# Patient Record
Sex: Male | Born: 1975 | Race: White | Hispanic: No | Marital: Married | Smoking: Never smoker
Health system: Southern US, Community
[De-identification: ages and names within clinical notes are randomized; demographics above are authoritative.]

## PROBLEM LIST (undated history)

## (undated) DIAGNOSIS — E039 Hypothyroidism, unspecified: Secondary | ICD-10-CM

## (undated) DIAGNOSIS — E079 Disorder of thyroid, unspecified: Secondary | ICD-10-CM

## (undated) DIAGNOSIS — E785 Hyperlipidemia, unspecified: Secondary | ICD-10-CM

## (undated) DIAGNOSIS — I10 Essential (primary) hypertension: Secondary | ICD-10-CM

## (undated) HISTORY — DX: Hyperlipidemia, unspecified: E78.5

## (undated) HISTORY — DX: Essential (primary) hypertension: I10

## (undated) HISTORY — PX: JOINT REPLACEMENT: SHX530

## (undated) HISTORY — PX: VASECTOMY: SHX75

---

## 2011-03-11 ENCOUNTER — Ambulatory Visit (INDEPENDENT_AMBULATORY_CARE_PROVIDER_SITE_OTHER): Payer: BC Managed Care – PPO | Admitting: Internal Medicine

## 2011-03-11 ENCOUNTER — Encounter: Payer: Self-pay | Admitting: Internal Medicine

## 2011-03-11 ENCOUNTER — Other Ambulatory Visit (INDEPENDENT_AMBULATORY_CARE_PROVIDER_SITE_OTHER): Payer: BC Managed Care – PPO

## 2011-03-11 VITALS — BP 128/84 | HR 72 | Temp 98.1°F | Resp 16 | Wt 232.0 lb

## 2011-03-11 DIAGNOSIS — Z0000 Encounter for general adult medical examination without abnormal findings: Secondary | ICD-10-CM

## 2011-03-11 LAB — URINALYSIS, ROUTINE W REFLEX MICROSCOPIC
Bilirubin Urine: NEGATIVE
Hgb urine dipstick: NEGATIVE
Leukocytes, UA: NEGATIVE
Nitrite: NEGATIVE
Total Protein, Urine: NEGATIVE

## 2011-03-11 LAB — COMPREHENSIVE METABOLIC PANEL
Albumin: 4.1 g/dL (ref 3.5–5.2)
BUN: 8 mg/dL (ref 6–23)
CO2: 28 mEq/L (ref 19–32)
Calcium: 8.7 mg/dL (ref 8.4–10.5)
Chloride: 104 mEq/L (ref 96–112)
GFR: 76.12 mL/min (ref >60.00)
Glucose, Bld: 104 mg/dL — ABNORMAL HIGH (ref 70–99)
Potassium: 3.9 mEq/L (ref 3.5–5.1)

## 2011-03-11 LAB — CBC WITH DIFFERENTIAL/PLATELET
Basophils Relative: 0.5 % (ref 0.0–3.0)
Eosinophils Relative: 3.5 % (ref 0.0–5.0)
Hemoglobin: 14.9 g/dL (ref 13.0–17.0)
Lymphocytes Relative: 46 % (ref 12.0–46.0)
Neutrophils Relative %: 41.2 % — ABNORMAL LOW (ref 43.0–77.0)
RBC: 4.65 Mil/uL (ref 4.22–5.81)
WBC: 5.2 10*3/uL (ref 4.5–10.5)

## 2011-03-11 LAB — TSH: TSH: 9.11 u[IU]/mL — ABNORMAL HIGH (ref 0.35–5.50)

## 2011-03-11 LAB — LIPID PANEL
HDL: 25.4 mg/dL — ABNORMAL LOW (ref >39.00)
VLDL: 43.2 mg/dL — ABNORMAL HIGH (ref 0.0–40.0)

## 2011-03-11 NOTE — Progress Notes (Signed)
  Subjective:    Patient ID: Travis Sawyer, male    DOB: 06/27/1975, 36 y.o.   MRN: [id number]  HPI New to me for a complete physical, he offers no complaints today.   Review of Systems  Constitutional: Negative.   HENT: Negative.   Eyes: Negative.   Respiratory: Negative.   Cardiovascular: Negative.   Gastrointestinal: Negative.   Genitourinary: Negative.   Musculoskeletal: Negative.   Skin: Negative.   Neurological: Negative.   Hematological: Negative.   Psychiatric/Behavioral: Negative.        Objective:   Physical Exam  Vitals reviewed. Constitutional: He is oriented to person, place, and time. He appears well-developed and well-nourished. No distress.  HENT:  Head: Normocephalic and atraumatic.  Mouth/Throat: Oropharynx is clear and moist. No oropharyngeal exudate.  Eyes: Conjunctivae are normal. Right eye exhibits no discharge. Left eye exhibits no discharge. No scleral icterus.  Neck: Normal range of motion. Neck supple. No JVD present. No tracheal deviation present. No thyromegaly present.  Cardiovascular: Normal rate, regular rhythm, normal heart sounds and intact distal pulses.  Exam reveals no gallop and no friction rub.   No murmur heard. Pulmonary/Chest: Effort normal and breath sounds normal. No stridor. No respiratory distress. He has no wheezes. He has no rales. He exhibits no tenderness.  Abdominal: Soft. Bowel sounds are normal. He exhibits no distension. There is no tenderness. There is no rebound and no guarding. Hernia confirmed negative in the right inguinal area and confirmed negative in the left inguinal area.  Genitourinary: Testes normal and penis normal. Right testis shows no mass, no swelling and no tenderness. Right testis is descended. Left testis shows no mass, no swelling and no tenderness. Left testis is descended. Circumcised. No penile tenderness. No discharge found.  Musculoskeletal: Normal range of motion. He exhibits no edema and no tenderness.    Lymphadenopathy:    He has no cervical adenopathy.       Right: No inguinal adenopathy present.       Left: No inguinal adenopathy present.  Neurological: He is oriented to person, place, and time.  Skin: Skin is warm and dry. No rash noted. He is not diaphoretic. No erythema. No pallor.  Psychiatric: He has a normal mood and affect. His behavior is normal. Judgment and thought content normal.          Assessment & Plan:

## 2011-03-11 NOTE — Assessment & Plan Note (Signed)
Exam done, labs ordered, pt ed material was given 

## 2011-03-11 NOTE — Patient Instructions (Signed)
Health Maintenance, Males A healthy lifestyle and preventative care can promote health and wellness.  Maintain regular health, dental, and eye exams.   Eat a healthy diet. Foods like vegetables, fruits, whole grains, low-fat dairy products, and lean protein foods contain the nutrients you need without too many calories. Decrease your intake of foods high in solid fats, added sugars, and salt. Get information about a proper diet from your caregiver, if necessary.   Regular physical exercise is one of the most important things you can do for your health. Most adults should get at least 150 minutes of moderate-intensity exercise (any activity that increases your heart rate and causes you to sweat) each week. In addition, most adults need muscle-strengthening exercises on 2 or more days a week.    Maintain a healthy weight. The body mass index (BMI) is a screening tool to identify possible weight problems. It provides an estimate of body fat based on height and weight. Your caregiver can help determine your BMI, and can help you achieve or maintain a healthy weight. For adults 20 years and older:   A BMI below 18.5 is considered underweight.   A BMI of 18.5 to 24.9 is normal.   A BMI of 25 to 29.9 is considered overweight.   A BMI of 30 and above is considered obese.   Maintain normal blood lipids and cholesterol by exercising and minimizing your intake of saturated fat. Eat a balanced diet with plenty of fruits and vegetables. Blood tests for lipids and cholesterol should begin at age 20 and be repeated every 5 years. If your lipid or cholesterol levels are high, you are over 50, or you are a high risk for heart disease, you may need your cholesterol levels checked more frequently.Ongoing high lipid and cholesterol levels should be treated with medicines, if diet and exercise are not effective.   If you smoke, find out from your caregiver how to quit. If you do not use tobacco, do not start.    If you choose to drink alcohol, do not exceed 2 drinks per day. One drink is considered to be 12 ounces (355 mL) of beer, 5 ounces (148 mL) of wine, or 1.5 ounces (44 mL) of liquor.   Avoid use of street drugs. Do not share needles with anyone. Ask for help if you need support or instructions about stopping the use of drugs.   High blood pressure causes heart disease and increases the risk of stroke. Blood pressure should be checked at least every 1 to 2 years. Ongoing high blood pressure should be treated with medicines if weight loss and exercise are not effective.   If you are 45 to 34 years old, ask your caregiver if you should take aspirin to prevent heart disease.   Diabetes screening involves taking a blood sample to check your fasting blood sugar level. This should be done once every 3 years, after age 45, if you are within normal weight and without risk factors for diabetes. Testing should be considered at a younger age or be carried out more frequently if you are overweight and have at least 1 risk factor for diabetes.   Colorectal cancer can be detected and often prevented. Most routine colorectal cancer screening begins at the age of 50 and continues through age 75. However, your caregiver may recommend screening at an earlier age if you have risk factors for colon cancer. On a yearly basis, your caregiver may provide home test kits to check for hidden   blood in the stool. Use of a small camera at the end of a tube, to directly examine the colon (sigmoidoscopy or colonoscopy), can detect the earliest forms of colorectal cancer. Talk to your caregiver about this at age 50, when routine screening begins. Direct examination of the colon should be repeated every 5 to 10 years through age 75, unless early forms of pre-cancerous polyps or small growths are found.   Healthy men should no longer receive prostate-specific antigen (PSA) blood tests as part of routine cancer screening. Consult with  your caregiver about prostate cancer screening.   Practice safe sex. Use condoms and avoid high-risk sexual practices to reduce the spread of sexually transmitted infections (STIs).   Use sunscreen with a sun protection factor (SPF) of 30 or greater. Apply sunscreen liberally and repeatedly throughout the day. You should seek shade when your shadow is shorter than you. Protect yourself by wearing long sleeves, pants, a wide-brimmed hat, and sunglasses year round, whenever you are outdoors.   Notify your caregiver of new moles or changes in moles, especially if there is a change in shape or color. Also notify your caregiver if a mole is larger than the size of a pencil eraser.   A one-time screening for abdominal aortic aneurysm (AAA) and surgical repair of large AAAs by sound wave imaging (ultrasonography) is recommended for ages 65 to 75 years who are current or former smokers.   Stay current with your immunizations.  Document Released: 07/27/2007 Document Revised: 10/10/2010 Document Reviewed: 06/25/2010 [employer] Patient Information 2012 [employer]. 

## 2011-03-18 ENCOUNTER — Ambulatory Visit (INDEPENDENT_AMBULATORY_CARE_PROVIDER_SITE_OTHER): Payer: BC Managed Care – PPO | Admitting: Internal Medicine

## 2011-03-18 ENCOUNTER — Encounter: Payer: Self-pay | Admitting: Internal Medicine

## 2011-03-18 VITALS — BP 120/68 | HR 82 | Temp 98.0°F | Resp 16 | Wt 233.0 lb

## 2011-03-18 DIAGNOSIS — R7303 Prediabetes: Secondary | ICD-10-CM | POA: Insufficient documentation

## 2011-03-18 DIAGNOSIS — K760 Fatty (change of) liver, not elsewhere classified: Secondary | ICD-10-CM

## 2011-03-18 DIAGNOSIS — K7689 Other specified diseases of liver: Secondary | ICD-10-CM

## 2011-03-18 DIAGNOSIS — R7309 Other abnormal glucose: Secondary | ICD-10-CM

## 2011-03-18 DIAGNOSIS — E039 Hypothyroidism, unspecified: Secondary | ICD-10-CM

## 2011-03-18 DIAGNOSIS — E782 Mixed hyperlipidemia: Secondary | ICD-10-CM

## 2011-03-18 MED ORDER — LEVOTHYROXINE SODIUM 50 MCG PO CAPS: 1.0000 | ORAL_CAPSULE | Freq: Every day | ORAL | Status: DC

## 2011-03-18 NOTE — Patient Instructions (Signed)
Hypertriglyceridemia  Diet for High blood levels of Triglycerides Most fats in food are triglycerides. Triglycerides in your blood are stored as fat in your body. High levels of triglycerides in your blood may put you at a greater risk for heart disease and stroke.  Normal triglyceride levels are less than 150 mg/dL. Borderline high levels are 150-199 mg/dl. High levels are 200 - 499 mg/dL, and very high triglyceride levels are greater than 500 mg/dL. The decision to treat high triglycerides is generally based on the level. For people with borderline or high triglyceride levels, treatment includes weight loss and exercise. Drugs are recommended for people with very high triglyceride levels. Many people who need treatment for high triglyceride levels have metabolic syndrome. This syndrome is a collection of disorders that often include: insulin resistance, high blood pressure, blood clotting problems, high cholesterol and triglycerides. TESTING PROCEDURE FOR TRIGLYCERIDES  You should not eat 4 hours before getting your triglycerides measured. The normal range of triglycerides is between 10 and 250 milligrams per deciliter (mg/dl). Some people may have extreme levels (1000 or above), but your triglyceride level may be too high if it is above 150 mg/dl, depending on what other risk factors you have for heart disease.   People with high blood triglycerides may also have high blood cholesterol levels. If you have high blood cholesterol as well as high blood triglycerides, your risk for heart disease is probably greater than if you only had high triglycerides. High blood cholesterol is one of the main risk factors for heart disease.  CHANGING YOUR DIET  Your weight can affect your blood triglyceride level. If you are more than 20% above your ideal body weight, you may be able to lower your blood triglycerides by losing weight. Eating less and exercising regularly is the best way to combat this. Fat provides  more calories than any other food. The best way to lose weight is to eat less fat. Only 30% of your total calories should come from fat. Less than 7% of your diet should come from saturated fat. A diet low in fat and saturated fat is the same as a diet to decrease blood cholesterol. By eating a diet lower in fat, you may lose weight, lower your blood cholesterol, and lower your blood triglyceride level.  Eating a diet low in fat, especially saturated fat, may also help you lower your blood triglyceride level. Ask your dietitian to help you figure how much fat you can eat based on the number of calories your caregiver has prescribed for you.  Exercise, in addition to helping with weight loss may also help lower triglyceride levels.   Alcohol can increase blood triglycerides. You may need to stop drinking alcoholic beverages.   Too much carbohydrate in your diet may also increase your blood triglycerides. Some complex carbohydrates are necessary in your diet. These may include bread, rice, potatoes, other starchy vegetables and cereals.   Reduce "simple" carbohydrates. These may include pure sugars, candy, honey, and jelly without losing other nutrients. If you have the kind of high blood triglycerides that is affected by the amount of carbohydrates in your diet, you will need to eat less sugar and less high-sugar foods. Your caregiver can help you with this.   Adding 2-4 grams of fish oil (EPA+ DHA) may also help lower triglycerides. Speak with your caregiver before adding any supplements to your regimen.  Following the Diet  Maintain your ideal weight. Your caregivers can help you with a diet. Generally,   eating less food and getting more exercise will help you lose weight. Joining a weight control group may also help. Ask your caregivers for a good weight control group in your area.  Eat low-fat foods instead of high-fat foods. This can help you lose weight too.  These foods are lower in fat. Eat MORE  of these:   Dried beans, peas, and lentils.   Egg whites.   Low-fat cottage cheese.   Fish.   Lean cuts of meat, such as round, sirloin, rump, and flank (cut extra fat off meat you fix).   Whole grain breads, cereals and pasta.   Skim and nonfat dry milk.   Low-fat yogurt.   Poultry without the skin.   Cheese made with skim or part-skim milk, such as mozzarella, parmesan, farmers', ricotta, or pot cheese.  These are higher fat foods. Eat LESS of these:   Whole milk and foods made from whole milk, such as American, blue, cheddar, monterey jack, and swiss cheese   High-fat meats, such as luncheon meats, sausages, knockwurst, bratwurst, hot dogs, ribs, corned beef, ground pork, and regular ground beef.   Fried foods.  Limit saturated fats in your diet. Substituting unsaturated fat for saturated fat may decrease your blood triglyceride level. You will need to read package labels to know which products contain saturated fats.  These foods are high in saturated fat. Eat LESS of these:   Fried pork skins.   Whole milk.   Skin and fat from poultry.   Palm oil.   Butter.   Shortening.   Cream cheese.   Bacon.   Margarines and baked goods made from listed oils.   Vegetable shortenings.   Chitterlings.   Fat from meats.   Coconut oil.   Palm kernel oil.   Lard.   Cream.   Sour cream.   Fatback.   Coffee whiteners and non-dairy creamers made with these oils.   Cheese made from whole milk.  Use unsaturated fats (both polyunsaturated and monounsaturated) moderately. Remember, even though unsaturated fats are better than saturated fats; you still want a diet low in total fat.  These foods are high in unsaturated fat:   Canola oil.   Sunflower oil.   Mayonnaise.   Almonds.   Peanuts.   Pine nuts.   Margarines made with these oils.   Safflower oil.   Olive oil.   Avocados.   Cashews.   Peanut butter.   Sunflower seeds.   Soybean oil.     Peanut oil.   Olives.   Pecans.   Walnuts.   Pumpkin seeds.  Avoid sugar and other high-sugar foods. This will decrease carbohydrates without decreasing other nutrients. Sugar in your food goes rapidly to your blood. When there is excess sugar in your blood, your liver may use it to make more triglycerides. Sugar also contains calories without other important nutrients.  Eat LESS of these:   Sugar, brown sugar, powdered sugar, jam, jelly, preserves, honey, syrup, molasses, pies, candy, cakes, cookies, frosting, pastries, colas, soft drinks, punches, fruit drinks, and regular gelatin.   Avoid alcohol. Alcohol, even more than sugar, may increase blood triglycerides. In addition, alcohol is high in calories and low in nutrients. Ask for sparkling water, or a diet soft drink instead of an alcoholic beverage.  Suggestions for planning and preparing meals   Bake, broil, grill or roast meats instead of frying.   Remove fat from meats and skin from poultry before cooking.   Add spices,   herbs, lemon juice or vinegar to vegetables instead of salt, rich sauces or gravies.   Use a non-stick skillet without fat or use no-stick sprays.   Cool and refrigerate stews and broth. Then remove the hardened fat floating on the surface before serving.   Refrigerate meat drippings and skim off fat to make low-fat gravies.   Serve more fish.   Use less butter, margarine and other high-fat spreads on bread or vegetables.   Use skim or reconstituted non-fat dry milk for cooking.   Cook with low-fat cheeses.   Substitute low-fat yogurt or cottage cheese for all or part of the sour cream in recipes for sauces, dips or congealed salads.   Use half yogurt/half mayonnaise in salad recipes.   Substitute evaporated skim milk for cream. Evaporated skim milk or reconstituted non-fat dry milk can be whipped and substituted for whipped cream in certain recipes.   Choose fresh fruits for dessert instead of  high-fat foods such as pies or cakes. Fruits are naturally low in fat.  When Dining Out   Order low-fat appetizers such as fruit or vegetable juice, pasta with vegetables or tomato sauce.   Select clear, rather than cream soups.   Ask that dressings and gravies be served on the side. Then use less of them.   Order foods that are baked, broiled, poached, steamed, stir-fried, or roasted.   Ask for margarine instead of butter, and use only a small amount.   Drink sparkling water, unsweetened tea or coffee, or diet soft drinks instead of alcohol or other sweet beverages.  QUESTIONS AND ANSWERS ABOUT OTHER FATS IN THE BLOOD: SATURATED FAT, TRANS FAT, AND CHOLESTEROL What is trans fat? Trans fat is a type of fat that is formed when vegetable oil is hardened through a process called hydrogenation. This process helps makes foods more solid, gives them shape, and prolongs their shelf life. Trans fats are also called hydrogenated or partially hydrogenated oils.  What do saturated fat, trans fat, and cholesterol in foods have to do with heart disease? Saturated fat, trans fat, and cholesterol in the diet all raise the level of LDL "bad" cholesterol in the blood. The higher the LDL cholesterol, the greater the risk for coronary heart disease (CHD). Saturated fat and trans fat raise LDL similarly.  What foods contain saturated fat, trans fat, and cholesterol? High amounts of saturated fat are found in animal products, such as fatty cuts of meat, chicken skin, and full-fat dairy products like butter, whole milk, cream, and cheese, and in tropical vegetable oils such as palm, palm kernel, and coconut oil. Trans fat is found in some of the same foods as saturated fat, such as vegetable shortening, some margarines (especially hard or stick margarine), crackers, cookies, baked goods, fried foods, salad dressings, and other processed foods made with partially hydrogenated vegetable oils. Small amounts of trans fat  also occur naturally in some animal products, such as milk products, beef, and lamb. Foods high in cholesterol include liver, other organ meats, egg yolks, shrimp, and full-fat dairy products. How can I use the new food label to make heart-healthy food choices? Check the Nutrition Facts panel of the food label. Choose foods lower in saturated fat, trans fat, and cholesterol. For saturated fat and cholesterol, you can also use the Percent Daily Value (%DV): 5% DV or less is low, and 20% DV or more is high. (There is no %DV for trans fat.) Use the Nutrition Facts panel to choose foods low in   saturated fat and cholesterol, and if the trans fat is not listed, read the ingredients and limit products that list shortening or hydrogenated or partially hydrogenated vegetable oil, which tend to be high in trans fat. POINTS TO REMEMBER: YOU NEED A LITTLE TLC (THERAPEUTIC LIFESTYLE CHANGES)  Discuss your risk for heart disease with your caregivers, and take steps to reduce risk factors.   Change your diet. Choose foods that are low in saturated fat, trans fat, and cholesterol.   Add exercise to your daily routine if it is not already being done. Participate in physical activity of moderate intensity, like brisk walking, for at least 30 minutes on most, and preferably all days of the week. No time? Break the 30 minutes into three, 10-minute segments during the day.   Stop smoking. If you do smoke, contact your caregiver to discuss ways in which they can help you quit.   Do not use street drugs.   Maintain a normal weight.   Maintain a healthy blood pressure.   Keep up with your blood work for checking the fats in your blood as directed by your caregiver.  Document Released: 11/16/2003 Document Revised: 10/10/2010 Document Reviewed: 06/13/2008 [facility] Patient Information 2012 [place], [place].Hypothyroidism The thyroid is a large gland located in the lower front of your neck. The thyroid gland helps control  metabolism. Metabolism is how your body handles food. It controls metabolism with the hormone thyroxine. When this gland is underactive (hypothyroid), it produces too little hormone.  CAUSES These include:   Absence or destruction of thyroid tissue.   Goiter due to iodine deficiency.   Goiter due to medications.   Congenital defects (since birth).   Problems with the pituitary. This causes a lack of TSH (thyroid stimulating hormone). This hormone tells the thyroid to turn out more hormone.  SYMPTOMS  Lethargy (feeling as though you have no energy)   Cold intolerance   Weight gain (in spite of normal food intake)   Dry skin   Coarse hair   Menstrual irregularity (if severe, may lead to infertility)   Slowing of thought processes  Cardiac problems are also caused by insufficient amounts of thyroid hormone. Hypothyroidism in the newborn is cretinism, and is an extreme form. It is important that this form be treated adequately and immediately or it will lead rapidly to retarded physical and mental development. DIAGNOSIS  To prove hypothyroidism, your caregiver may do blood tests and ultrasound tests. Sometimes the signs are hidden. It may be necessary for your caregiver to watch this illness with blood tests either before or after diagnosis and treatment. TREATMENT  Low levels of thyroid hormone are increased by using synthetic thyroid hormone. This is a safe, effective treatment. It usually takes about four weeks to gain the full effects of the medication. After you have the full effect of the medication, it will generally take another four weeks for problems to leave. Your caregiver may start you on low doses. If you have had heart problems the dose may be gradually increased. It is generally not an emergency to get rapidly to normal. HOME CARE INSTRUCTIONS   Take your medications as your caregiver suggests. Let your caregiver know of any medications you are taking or start taking.  Your caregiver will help you with dosage schedules.   As your condition improves, your dosage needs may increase. It will be necessary to have continuing blood tests as suggested by your caregiver.   Report all suspected medication side effects to your  caregiver.  SEEK MEDICAL CARE IF: Seek medical care if you develop:  Sweating.   Tremulousness (tremors).   Anxiety.   Rapid weight loss.   Heat intolerance.   Emotional swings.   Diarrhea.   Weakness.  SEEK IMMEDIATE MEDICAL CARE IF:  You develop chest pain, an irregular heart beat (palpitations), or a rapid heart beat. MAKE SURE YOU:   Understand these instructions.   Will watch your condition.   Will get help right away if you are not doing well or get worse.  Document Released: 01/28/2005 Document Revised: 10/10/2010 Document Reviewed: 09/18/2007 [facility] Patient Information 2012 [place], [place].

## 2011-03-18 NOTE — Progress Notes (Signed)
  Subjective:    Patient ID: Travis Sawyer, male    DOB: 1976/01/10, 36 y.o.   MRN: [id number]  Thyroid Problem Presents for follow-up visit. Symptoms include dry skin, fatigue and weight gain. Patient reports no anxiety, cold intolerance, constipation, depressed mood, diaphoresis, diarrhea, hair loss, heat intolerance, hoarse voice, leg swelling, nail problem, palpitations, tremors, visual change or weight loss. The symptoms have been stable.      Review of Systems  Constitutional: Positive for weight gain, fatigue and unexpected weight change (some weight gain). Negative for fever, chills, weight loss, diaphoresis, activity change and appetite change.  HENT: Negative.  Negative for hoarse voice.   Eyes: Negative.   Respiratory: Negative.   Cardiovascular: Negative for chest pain, palpitations and leg swelling.  Gastrointestinal: Negative for nausea, vomiting, abdominal pain, diarrhea, constipation, blood in stool, abdominal distention and anal bleeding.  Genitourinary: Negative.   Musculoskeletal: Negative for myalgias, back pain, arthralgias and gait problem.  Skin: Negative.   Neurological: Negative.  Negative for tremors.  Hematological: Negative for cold intolerance, heat intolerance and adenopathy. Does not bruise/bleed easily.  Psychiatric/Behavioral: Negative.        Objective:   Physical Exam  Vitals reviewed. Constitutional: He is oriented to person, place, and time. He appears well-developed and well-nourished. No distress.  HENT:  Head: Normocephalic and atraumatic.  Mouth/Throat: Oropharynx is clear and moist. No oropharyngeal exudate.  Eyes: Conjunctivae are normal. Right eye exhibits no discharge. Left eye exhibits no discharge. No scleral icterus.  Neck: Normal range of motion. Neck supple. No JVD present. No tracheal deviation present. No thyromegaly present.  Cardiovascular: Normal rate, regular rhythm, normal heart sounds and intact distal pulses.  Exam reveals no  gallop and no friction rub.   No murmur heard. Pulmonary/Chest: Effort normal and breath sounds normal. No stridor. No respiratory distress. He has no wheezes. He has no rales. He exhibits no tenderness.  Abdominal: Soft. Bowel sounds are normal. He exhibits no distension and no mass. There is no tenderness. There is no rebound and no guarding.  Musculoskeletal: Normal range of motion. He exhibits no edema and no tenderness.  Lymphadenopathy:    He has no cervical adenopathy.  Neurological: He is oriented to person, place, and time.  Skin: Skin is warm and dry. No rash noted. He is not diaphoretic. No erythema. No pallor.  Psychiatric: He has a normal mood and affect. His behavior is normal. Judgment and thought content normal.     Lab Results  Component Value Date   WBC 5.2 03/11/2011   HGB 14.9 03/11/2011   HCT 41.6 03/11/2011   PLT 168.0 03/11/2011   GLUCOSE 104* 03/11/2011   CHOL 186 03/11/2011   TRIG 216.0* 03/11/2011   HDL 25.40* 03/11/2011   LDLDIRECT 116.1 03/11/2011   ALT 74* 03/11/2011   AST 44* 03/11/2011   NA 140 03/11/2011   K 3.9 03/11/2011   CL 104 03/11/2011   CREATININE 1.2 03/11/2011   BUN 8 03/11/2011   CO2 28 03/11/2011   TSH 9.11* 03/11/2011       Assessment & Plan:

## 2011-03-19 ENCOUNTER — Encounter: Payer: Self-pay | Admitting: Internal Medicine

## 2011-03-19 NOTE — Assessment & Plan Note (Signed)
Start working with a nutritionist

## 2011-03-19 NOTE — Assessment & Plan Note (Signed)
Start tirosint 

## 2011-03-19 NOTE — Assessment & Plan Note (Signed)
He will work on lifestyle modifications and he will see a nutritionist

## 2011-03-19 NOTE — Assessment & Plan Note (Signed)
He will work on lifestyle modifications

## 2011-05-20 ENCOUNTER — Encounter: Payer: Self-pay | Admitting: Internal Medicine

## 2011-05-20 ENCOUNTER — Other Ambulatory Visit (INDEPENDENT_AMBULATORY_CARE_PROVIDER_SITE_OTHER): Payer: BC Managed Care – PPO

## 2011-05-20 ENCOUNTER — Ambulatory Visit (INDEPENDENT_AMBULATORY_CARE_PROVIDER_SITE_OTHER): Payer: BC Managed Care – PPO | Admitting: Internal Medicine

## 2011-05-20 VITALS — BP 118/80 | HR 87 | Temp 98.6°F | Resp 16 | Ht 68.0 in | Wt 221.0 lb

## 2011-05-20 DIAGNOSIS — K7689 Other specified diseases of liver: Secondary | ICD-10-CM

## 2011-05-20 DIAGNOSIS — K760 Fatty (change of) liver, not elsewhere classified: Secondary | ICD-10-CM

## 2011-05-20 DIAGNOSIS — E039 Hypothyroidism, unspecified: Secondary | ICD-10-CM

## 2011-05-20 DIAGNOSIS — R7309 Other abnormal glucose: Secondary | ICD-10-CM

## 2011-05-20 DIAGNOSIS — E782 Mixed hyperlipidemia: Secondary | ICD-10-CM

## 2011-05-20 LAB — COMPREHENSIVE METABOLIC PANEL
Albumin: 4.3 g/dL (ref 3.5–5.2)
BUN: 12 mg/dL (ref 6–23)
CO2: 28 mEq/L (ref 19–32)
Calcium: 9.4 mg/dL (ref 8.4–10.5)
Chloride: 101 mEq/L (ref 96–112)
GFR: 76.03 mL/min (ref >60.00)
Glucose, Bld: 107 mg/dL — ABNORMAL HIGH (ref 70–99)
Potassium: 3.8 mEq/L (ref 3.5–5.1)
Sodium: 139 mEq/L (ref 135–145)
Total Protein: 7.3 g/dL (ref 6.0–8.3)

## 2011-05-20 LAB — LDL CHOLESTEROL, DIRECT: Direct LDL: 118.6 mg/dL

## 2011-05-20 LAB — LIPID PANEL: HDL: 28.2 mg/dL — ABNORMAL LOW (ref >39.00)

## 2011-05-20 LAB — TSH: TSH: 4.05 u[IU]/mL (ref 0.35–5.50)

## 2011-05-20 NOTE — Assessment & Plan Note (Signed)
Will check his CMP today

## 2011-05-20 NOTE — Patient Instructions (Signed)

## 2011-05-20 NOTE — Progress Notes (Signed)
  Subjective:    Patient ID: Travis Sawyer, male    DOB: [birth year], 36 y.o.   MRN: [id number]  Thyroid Problem Presents for follow-up visit. Patient reports no anxiety, cold intolerance, constipation, depressed mood, diaphoresis, diarrhea, dry skin, fatigue, hair loss, heat intolerance, hoarse voice, leg swelling, nail problem, palpitations, tremors, visual change, weight gain or weight loss. The symptoms have been improving.      Review of Systems  Constitutional: Negative for fever, chills, weight loss, weight gain, diaphoresis, activity change, appetite change, fatigue and unexpected weight change.  HENT: Negative.  Negative for hoarse voice.   Eyes: Negative.   Respiratory: Negative for apnea, cough, choking, shortness of breath, wheezing and stridor.   Cardiovascular: Negative for chest pain, palpitations and leg swelling.  Gastrointestinal: Negative for nausea, vomiting, abdominal pain, diarrhea, constipation and blood in stool.  Genitourinary: Negative.   Musculoskeletal: Negative for myalgias, back pain, arthralgias and gait problem.  Skin: Negative for color change, pallor, rash and wound.  Neurological: Negative.  Negative for tremors.  Hematological: Negative for cold intolerance, heat intolerance and adenopathy. Does not bruise/bleed easily.  Psychiatric/Behavioral: Negative.        Objective:   Physical Exam  Vitals reviewed. Constitutional: He is oriented to person, place, and time. He appears well-developed and well-nourished. No distress.  HENT:  Head: Normocephalic and atraumatic.  Mouth/Throat: Oropharynx is clear and moist. No oropharyngeal exudate.  Eyes: Conjunctivae are normal. Right eye exhibits no discharge. Left eye exhibits no discharge. No scleral icterus.  Neck: Normal range of motion. Neck supple. No JVD present. No tracheal deviation present. No thyromegaly present.  Cardiovascular: Normal rate, regular rhythm, normal heart sounds and intact distal  pulses.  Exam reveals no gallop and no friction rub.   No murmur heard. Pulmonary/Chest: Effort normal and breath sounds normal. No stridor. No respiratory distress. He has no wheezes. He has no rales. He exhibits no tenderness.  Abdominal: Soft. Bowel sounds are normal. He exhibits no distension and no mass. There is no tenderness. There is no rebound and no guarding.  Musculoskeletal: Normal range of motion. He exhibits no edema and no tenderness.  Lymphadenopathy:    He has no cervical adenopathy.  Neurological: He is oriented to person, place, and time.  Skin: Skin is warm and dry. No rash noted. He is not diaphoretic. No erythema. No pallor.  Psychiatric: He has a normal mood and affect. His behavior is normal. Judgment and thought content normal.      Lab Results  Component Value Date   WBC 5.2 03/11/2011   HGB 14.9 03/11/2011   HCT 41.6 03/11/2011   PLT 168.0 03/11/2011   GLUCOSE 104* 03/11/2011   CHOL 186 03/11/2011   TRIG 216.0* 03/11/2011   HDL 25.40* 03/11/2011   LDLDIRECT 116.1 03/11/2011   ALT 74* 03/11/2011   AST 44* 03/11/2011   NA 140 03/11/2011   K 3.9 03/11/2011   CL 104 03/11/2011   CREATININE 1.2 03/11/2011   BUN 8 03/11/2011   CO2 28 03/11/2011   TSH 9.11* 03/11/2011      Assessment & Plan:

## 2011-05-20 NOTE — Assessment & Plan Note (Signed)
a1c today to screen for DM II

## 2011-05-20 NOTE — Assessment & Plan Note (Signed)
I will recheck his TSH level today 

## 2011-05-20 NOTE — Assessment & Plan Note (Signed)
Will recheck the trigs level today

## 2011-05-23 ENCOUNTER — Encounter: Payer: Self-pay | Admitting: Internal Medicine

## 2011-05-23 LAB — HEMOGLOBIN A1C: Hgb A1c MFr Bld: 5.6 % (ref 4.6–6.5)

## 2011-06-11 ENCOUNTER — Ambulatory Visit: Payer: BC Managed Care – PPO | Admitting: *Deleted

## 2011-10-15 ENCOUNTER — Other Ambulatory Visit: Payer: Self-pay

## 2011-10-15 DIAGNOSIS — E039 Hypothyroidism, unspecified: Secondary | ICD-10-CM

## 2011-10-15 MED ORDER — LEVOTHYROXINE SODIUM 50 MCG PO CAPS: 1.0000 | ORAL_CAPSULE | Freq: Every day | ORAL | Status: DC

## 2012-10-26 ENCOUNTER — Other Ambulatory Visit: Payer: Self-pay | Admitting: Internal Medicine

## 2012-11-13 ENCOUNTER — Other Ambulatory Visit: Payer: Self-pay | Admitting: Internal Medicine

## 2012-12-02 ENCOUNTER — Other Ambulatory Visit: Payer: Self-pay | Admitting: Internal Medicine

## 2013-02-19 ENCOUNTER — Encounter: Payer: Self-pay | Admitting: Internal Medicine

## 2013-02-19 ENCOUNTER — Other Ambulatory Visit (INDEPENDENT_AMBULATORY_CARE_PROVIDER_SITE_OTHER): Payer: BC Managed Care – PPO

## 2013-02-19 ENCOUNTER — Ambulatory Visit (INDEPENDENT_AMBULATORY_CARE_PROVIDER_SITE_OTHER): Payer: BC Managed Care – PPO | Admitting: Internal Medicine

## 2013-02-19 VITALS — BP 116/80 | HR 63 | Temp 97.8°F | Resp 16 | Ht 68.0 in | Wt 234.5 lb

## 2013-02-19 DIAGNOSIS — Z0000 Encounter for general adult medical examination without abnormal findings: Secondary | ICD-10-CM

## 2013-02-19 DIAGNOSIS — R7309 Other abnormal glucose: Secondary | ICD-10-CM

## 2013-02-19 DIAGNOSIS — E039 Hypothyroidism, unspecified: Secondary | ICD-10-CM

## 2013-02-19 LAB — CBC WITH DIFFERENTIAL/PLATELET
Basophils Absolute: 0 10*3/uL (ref 0.0–0.1)
Basophils Relative: 0.1 % (ref 0.0–3.0)
EOS PCT: 2.8 % (ref 0.0–5.0)
Eosinophils Absolute: 0.2 10*3/uL (ref 0.0–0.7)
HEMATOCRIT: 45.8 % (ref 39.0–52.0)
HEMOGLOBIN: 15.8 g/dL (ref 13.0–17.0)
LYMPHS ABS: 2.7 10*3/uL (ref 0.7–4.0)
Lymphocytes Relative: 37.7 % (ref 12.0–46.0)
MCHC: 34.5 g/dL (ref 30.0–36.0)
MCV: 91.5 fl (ref 78.0–100.0)
MONOS PCT: 5.2 % (ref 3.0–12.0)
Monocytes Absolute: 0.4 10*3/uL (ref 0.1–1.0)
NEUTROS ABS: 3.8 10*3/uL (ref 1.4–7.7)
Neutrophils Relative %: 54.2 % (ref 43.0–77.0)
Platelets: 216 10*3/uL (ref 150.0–400.0)
RBC: 5 Mil/uL (ref 4.22–5.81)
RDW: 13 % (ref 11.5–14.6)
WBC: 7.1 10*3/uL (ref 4.5–10.5)

## 2013-02-19 LAB — COMPREHENSIVE METABOLIC PANEL
ALT: 37 U/L (ref 0–53)
AST: 22 U/L (ref 0–37)
Albumin: 4.2 g/dL (ref 3.5–5.2)
Alkaline Phosphatase: 38 U/L — ABNORMAL LOW (ref 39–117)
BILIRUBIN TOTAL: 0.7 mg/dL (ref 0.3–1.2)
BUN: 12 mg/dL (ref 6–23)
CO2: 27 mEq/L (ref 19–32)
CREATININE: 1.1 mg/dL (ref 0.4–1.5)
Calcium: 9 mg/dL (ref 8.4–10.5)
Chloride: 106 mEq/L (ref 96–112)
GFR: 85.26 mL/min (ref >60.00)
Glucose, Bld: 101 mg/dL — ABNORMAL HIGH (ref 70–99)
Potassium: 4.6 mEq/L (ref 3.5–5.1)
Sodium: 139 mEq/L (ref 135–145)
Total Protein: 7.2 g/dL (ref 6.0–8.3)

## 2013-02-19 LAB — LIPID PANEL
CHOLESTEROL: 251 mg/dL — AB (ref 0–200)
HDL: 31.6 mg/dL — ABNORMAL LOW (ref >39.00)
TRIGLYCERIDES: 155 mg/dL — AB (ref 0.0–149.0)
Total CHOL/HDL Ratio: 8
VLDL: 31 mg/dL (ref 0.0–40.0)

## 2013-02-19 LAB — TSH: TSH: 11.73 u[IU]/mL — ABNORMAL HIGH (ref 0.35–5.50)

## 2013-02-19 LAB — LDL CHOLESTEROL, DIRECT: LDL DIRECT: 184.5 mg/dL

## 2013-02-19 LAB — HEMOGLOBIN A1C: Hgb A1c MFr Bld: 5.8 % (ref 4.6–6.5)

## 2013-02-19 MED ORDER — LEVOTHYROXINE SODIUM 50 MCG PO TABS: 50.0000 ug | ORAL_TABLET | Freq: Every day | ORAL | Status: DC

## 2013-02-19 NOTE — Assessment & Plan Note (Signed)
He has not taken the tirosint for 5 months I will recheck his TSH level today and will advise further

## 2013-02-19 NOTE — Progress Notes (Signed)
Pre visit review using our clinic review tool, if applicable. No additional management support is needed unless otherwise documented below in the visit note. 

## 2013-02-19 NOTE — Progress Notes (Signed)
Subjective:    Patient ID: Travis Sawyer, male    DOB: 07/26/1975, 38 y.o.   MRN: [id number]  Thyroid Problem Presents for follow-up visit. Symptoms include fatigue and weight gain. Patient reports no anxiety, cold intolerance, constipation, depressed mood, diaphoresis, diarrhea, dry skin, hair loss, heat intolerance, hoarse voice, leg swelling, nail problem, palpitations, tremors, visual change or weight loss. The symptoms have been worsening. Past treatments include levothyroxine. The treatment provided mild relief.      Review of Systems  Constitutional: Positive for weight gain, fatigue and unexpected weight change. Negative for fever, weight loss and diaphoresis.  HENT: Negative.  Negative for hoarse voice.   Eyes: Negative.   Respiratory: Negative.  Negative for chest tightness.   Cardiovascular: Negative.  Negative for chest pain, palpitations and leg swelling.  Gastrointestinal: Negative.  Negative for abdominal pain, diarrhea and constipation.  Endocrine: Negative.  Negative for cold intolerance and heat intolerance.  Genitourinary: Negative.   Musculoskeletal: Negative.   Allergic/Immunologic: Negative.   Neurological: Negative.  Negative for tremors.  Hematological: Negative.   Psychiatric/Behavioral: Negative.        Objective:   Physical Exam  Vitals reviewed. Constitutional: He is oriented to person, place, and time. He appears well-developed and well-nourished. No distress.  HENT:  Head: Normocephalic and atraumatic.  Mouth/Throat: No oropharyngeal exudate.  Eyes: Conjunctivae are normal. Right eye exhibits no discharge. Left eye exhibits no discharge. No scleral icterus.  Neck: Normal range of motion. Neck supple. No JVD present. No tracheal deviation present. No thyromegaly present.  Cardiovascular: Normal rate, regular rhythm, normal heart sounds and intact distal pulses.  Exam reveals no gallop and no friction rub.   No murmur heard. Pulmonary/Chest: Effort  normal and breath sounds normal. No stridor. No respiratory distress. He has no wheezes. He has no rales. He exhibits no tenderness.  Abdominal: Soft. Bowel sounds are normal. He exhibits no distension and no mass. There is no tenderness. There is no rebound and no guarding. Hernia confirmed negative in the right inguinal area and confirmed negative in the left inguinal area.  Genitourinary: Testes normal and penis normal. Right testis shows no mass, no swelling and no tenderness. Right testis is descended. Left testis shows no mass, no swelling and no tenderness. Left testis is descended. Circumcised. No penile erythema or penile tenderness. No discharge found.  Musculoskeletal: Normal range of motion. He exhibits no edema and no tenderness.  Lymphadenopathy:    He has no cervical adenopathy.       Right: No inguinal adenopathy present.       Left: No inguinal adenopathy present.  Neurological: He is oriented to person, place, and time.  Skin: Skin is warm and dry. No rash noted. He is not diaphoretic. No erythema. No pallor.  Psychiatric: He has a normal mood and affect. His behavior is normal. Judgment and thought content normal.    Lab Results  Component Value Date   WBC 5.2 03/11/2011   HGB 14.9 03/11/2011   HCT 41.6 03/11/2011   PLT 168.0 03/11/2011   GLUCOSE 107* 05/20/2011   CHOL 177 05/20/2011   TRIG 217.0* 05/20/2011   HDL 28.20* 05/20/2011   LDLDIRECT 118.6 05/20/2011   ALT 31 05/20/2011   AST 18 05/20/2011   NA 139 05/20/2011   K 3.8 05/20/2011   CL 101 05/20/2011   CREATININE 1.2 05/20/2011   BUN 12 05/20/2011   CO2 28 05/20/2011   TSH 4.05 05/20/2011   HGBA1C 5.6 05/20/2011  Assessment & Plan:

## 2013-02-19 NOTE — Addendum Note (Signed)
Addended by: Etta GrandchildJONES, Vlasta Baskin L on: 02/19/2013 01:06 PM   Modules accepted: Orders

## 2013-02-19 NOTE — Assessment & Plan Note (Signed)
I will check his A1C to see if he has developed DM2 

## 2013-02-19 NOTE — Assessment & Plan Note (Signed)
Exam done Vaccines were reviewed Labs ordered Pt ed material was given 

## 2013-02-19 NOTE — Patient Instructions (Signed)
Hypothyroidism The thyroid is a large gland located in the lower front of your neck. The thyroid gland helps control metabolism. Metabolism is how your body handles food. It controls metabolism with the hormone thyroxine. When this gland is underactive (hypothyroid), it produces too little hormone.  CAUSES These include:   Absence or destruction of thyroid tissue.  Goiter due to iodine deficiency.  Goiter due to medications.  Congenital defects (since birth).  Problems with the pituitary. This causes a lack of TSH (thyroid stimulating hormone). This hormone tells the thyroid to turn out more hormone. SYMPTOMS  Lethargy (feeling as though you have no energy)  Cold intolerance  Weight gain (in spite of normal food intake)  Dry skin  Coarse hair  Menstrual irregularity (if severe, may lead to infertility)  Slowing of thought processes Cardiac problems are also caused by insufficient amounts of thyroid hormone. Hypothyroidism in the newborn is cretinism, and is an extreme form. It is important that this form be treated adequately and immediately or it will lead rapidly to retarded physical and mental development. DIAGNOSIS  To prove hypothyroidism, your caregiver may do blood tests and ultrasound tests. Sometimes the signs are hidden. It may be necessary for your caregiver to watch this illness with blood tests either before or after diagnosis and treatment. TREATMENT  Low levels of thyroid hormone are increased by using synthetic thyroid hormone. This is a safe, effective treatment. It usually takes about four weeks to gain the full effects of the medication. After you have the full effect of the medication, it will generally take another four weeks for problems to leave. Your caregiver may start you on low doses. If you have had heart problems the dose may be gradually increased. It is generally not an emergency to get rapidly to normal. HOME CARE INSTRUCTIONS   Take your  medications as your caregiver suggests. Let your caregiver know of any medications you are taking or start taking. Your caregiver will help you with dosage schedules.  As your condition improves, your dosage needs may increase. It will be necessary to have continuing blood tests as suggested by your caregiver.  Report all suspected medication side effects to your caregiver. SEEK MEDICAL CARE IF: Seek medical care if you develop:  Sweating.  Tremulousness (tremors).  Anxiety.  Rapid weight loss.  Heat intolerance.  Emotional swings.  Diarrhea.  Weakness. SEEK IMMEDIATE MEDICAL CARE IF:  You develop chest pain, an irregular heart beat (palpitations), or a rapid heart beat. MAKE SURE YOU:   Understand these instructions.  Will watch your condition.  Will get help right away if you are not doing well or get worse. Document Released: 01/28/2005 Document Revised: 04/22/2011 Document Reviewed: 09/18/2007 [facility] Patient Information 2014 [place], [place]. Health Maintenance, Males A healthy lifestyle and preventative care can promote health and wellness.  Maintain regular health, dental, and eye exams.  Eat a healthy diet. Foods like vegetables, fruits, whole grains, low-fat dairy products, and lean protein foods contain the nutrients you need without too many calories. Decrease your intake of foods high in solid fats, added sugars, and salt. Get information about a proper diet from your caregiver, if necessary.  Regular physical exercise is one of the most important things you can do for your health. Most adults should get at least 150 minutes of moderate-intensity exercise (any activity that increases your heart rate and causes you to sweat) each week. In addition, most adults need muscle-strengthening exercises on 2 or more days a week.  Maintain a healthy weight. The body mass index (BMI) is a screening tool to identify possible weight problems. It provides an estimate of  body fat based on height and weight. Your caregiver can help determine your BMI, and can help you achieve or maintain a healthy weight. For adults 20 years and older:  A BMI below 18.5 is considered underweight.  A BMI of 18.5 to 24.9 is normal.  A BMI of 25 to 29.9 is considered overweight.  A BMI of 30 and above is considered obese.  Maintain normal blood lipids and cholesterol by exercising and minimizing your intake of saturated fat. Eat a balanced diet with plenty of fruits and vegetables. Blood tests for lipids and cholesterol should begin at age 86 and be repeated every 5 years. If your lipid or cholesterol levels are high, you are over 50, or you are a high risk for heart disease, you may need your cholesterol levels checked more frequently.Ongoing high lipid and cholesterol levels should be treated with medicines, if diet and exercise are not effective.  If you smoke, find out from your caregiver how to quit. If you do not use tobacco, do not start.  Lung cancer screening is recommended for adults aged 51 80 years who are at high risk for developing lung cancer because of a history of smoking. Yearly low-dose computed tomography (CT) is recommended for people who have at least a 30-pack-year history of smoking and are a current smoker or have quit within the past 15 years. A pack year of smoking is smoking an average of 1 pack of cigarettes a day for 1 year (for example: 1 pack a day for 30 years or 2 packs a day for 15 years). Yearly screening should continue until the smoker has stopped smoking for at least 15 years. Yearly screening should also be stopped for people who develop a health problem that would prevent them from having lung cancer treatment.  If you choose to drink alcohol, do not exceed 2 drinks per day. One drink is considered to be 12 ounces (355 mL) of beer, 5 ounces (148 mL) of wine, or 1.5 ounces (44 mL) of liquor.  Avoid use of street drugs. Do not share needles with  anyone. Ask for help if you need support or instructions about stopping the use of drugs.  High blood pressure causes heart disease and increases the risk of stroke. Blood pressure should be checked at least every 1 to 2 years. Ongoing high blood pressure should be treated with medicines if weight loss and exercise are not effective.  If you are 67 to 36 years old, ask your caregiver if you should take aspirin to prevent heart disease.  Diabetes screening involves taking a blood sample to check your fasting blood sugar level. This should be done once every 3 years, after age 72, if you are within normal weight and without risk factors for diabetes. Testing should be considered at a younger age or be carried out more frequently if you are overweight and have at least 1 risk factor for diabetes.  Colorectal cancer can be detected and often prevented. Most routine colorectal cancer screening begins at the age of 61 and continues through age 54. However, your caregiver may recommend screening at an earlier age if you have risk factors for colon cancer. On a yearly basis, your caregiver may provide home test kits to check for hidden blood in the stool. Use of a small camera at the end of a  tube, to directly examine the colon (sigmoidoscopy or colonoscopy), can detect the earliest forms of colorectal cancer. Talk to your caregiver about this at age 80, when routine screening begins. Direct examination of the colon should be repeated every 5 to 10 years through age 65, unless early forms of pre-cancerous polyps or small growths are found.  Hepatitis C blood testing is recommended for all people born from [birth year] through 1963 and any individual with known risks for hepatitis C.  Healthy men should no longer receive prostate-specific antigen (PSA) blood tests as part of routine cancer screening. Consult with your caregiver about prostate cancer screening.  Testicular cancer screening is not recommended for  adolescents or adult males who have no symptoms. Screening includes self-exam, caregiver exam, and other screening tests. Consult with your caregiver about any symptoms you have or any concerns you have about testicular cancer.  Practice safe sex. Use condoms and avoid high-risk sexual practices to reduce the spread of sexually transmitted infections (STIs).  Use sunscreen. Apply sunscreen liberally and repeatedly throughout the day. You should seek shade when your shadow is shorter than you. Protect yourself by wearing long sleeves, pants, a wide-brimmed hat, and sunglasses year round, whenever you are outdoors.  Notify your caregiver of new moles or changes in moles, especially if there is a change in shape or color. Also notify your caregiver if a mole is larger than the size of a pencil eraser.  A one-time screening for abdominal aortic aneurysm (AAA) and surgical repair of large AAAs by sound wave imaging (ultrasonography) is recommended for ages 54 to 42 years who are current or former smokers.  Stay current with your immunizations. Document Released: 07/27/2007 Document Revised: 05/25/2012 Document Reviewed: 06/25/2010 [facility] Patient Information 2014 [place], [place].

## 2013-05-18 ENCOUNTER — Ambulatory Visit: Payer: BC Managed Care – PPO | Admitting: Internal Medicine

## 2013-05-27 ENCOUNTER — Encounter: Payer: Self-pay | Admitting: Internal Medicine

## 2013-05-27 ENCOUNTER — Ambulatory Visit (INDEPENDENT_AMBULATORY_CARE_PROVIDER_SITE_OTHER): Payer: BC Managed Care – PPO | Admitting: Internal Medicine

## 2013-05-27 ENCOUNTER — Other Ambulatory Visit (INDEPENDENT_AMBULATORY_CARE_PROVIDER_SITE_OTHER): Payer: BC Managed Care – PPO

## 2013-05-27 VITALS — BP 124/82 | HR 88 | Temp 97.8°F | Resp 16 | Ht 68.0 in | Wt 238.8 lb

## 2013-05-27 DIAGNOSIS — E039 Hypothyroidism, unspecified: Secondary | ICD-10-CM

## 2013-05-27 DIAGNOSIS — R7309 Other abnormal glucose: Secondary | ICD-10-CM

## 2013-05-27 DIAGNOSIS — E782 Mixed hyperlipidemia: Secondary | ICD-10-CM

## 2013-05-27 LAB — BASIC METABOLIC PANEL
BUN: 11 mg/dL (ref 6–23)
CHLORIDE: 101 meq/L (ref 96–112)
CO2: 27 meq/L (ref 19–32)
Calcium: 9.6 mg/dL (ref 8.4–10.5)
Creatinine, Ser: 1.1 mg/dL (ref 0.4–1.5)
GFR: 79.02 mL/min (ref >60.00)
Glucose, Bld: 92 mg/dL (ref 70–99)
Potassium: 4.3 mEq/L (ref 3.5–5.1)
Sodium: 136 mEq/L (ref 135–145)

## 2013-05-27 LAB — LIPID PANEL
CHOL/HDL RATIO: 7
Cholesterol: 228 mg/dL — ABNORMAL HIGH (ref 0–200)
HDL: 31.7 mg/dL — AB (ref >39.00)
LDL Cholesterol: 147 mg/dL — ABNORMAL HIGH (ref 0–99)
Triglycerides: 245 mg/dL — ABNORMAL HIGH (ref 0.0–149.0)
VLDL: 49 mg/dL — ABNORMAL HIGH (ref 0.0–40.0)

## 2013-05-27 LAB — TSH: TSH: 13.63 u[IU]/mL — AB (ref 0.35–5.50)

## 2013-05-27 MED ORDER — LEVOTHYROXINE SODIUM 75 MCG PO TABS: 75.0000 ug | ORAL_TABLET | Freq: Every day | ORAL | Status: DC

## 2013-05-27 NOTE — Progress Notes (Signed)
Pre visit review using our clinic review tool, if applicable. No additional management support is needed unless otherwise documented below in the visit note. 

## 2013-05-27 NOTE — Progress Notes (Signed)
   Subjective:    Patient ID: Travis Sawyer, male    DOB: 03/19/1975, 38 y.o.   MRN: [id number]  Thyroid Problem Presents for follow-up visit. Symptoms include fatigue and weight gain. Patient reports no anxiety, cold intolerance, constipation, depressed mood, diaphoresis, diarrhea, dry skin, hair loss, heat intolerance, hoarse voice, leg swelling, nail problem, palpitations, tremors, visual change or weight loss. Past treatments include levothyroxine. The treatment provided moderate relief.      Review of Systems  Constitutional: Positive for weight gain, fatigue and unexpected weight change. Negative for fever, weight loss and diaphoresis.  HENT: Negative.  Negative for hoarse voice.   Eyes: Negative.   Respiratory: Negative.  Negative for cough, choking, chest tightness, shortness of breath and wheezing.   Cardiovascular: Negative.  Negative for chest pain, palpitations and leg swelling.  Gastrointestinal: Negative.  Negative for abdominal pain, diarrhea and constipation.  Endocrine: Negative for cold intolerance, heat intolerance, polydipsia, polyphagia and polyuria.  Genitourinary: Negative.   Musculoskeletal: Negative.   Skin: Negative.   Neurological: Negative.  Negative for dizziness and tremors.  Hematological: Negative.  Negative for adenopathy. Does not bruise/bleed easily.  Psychiatric/Behavioral: Negative.   All other systems reviewed and are negative.      Objective:   Physical Exam  Vitals reviewed. Constitutional: He is oriented to person, place, and time. He appears well-developed and well-nourished. No distress.  HENT:  Head: Normocephalic and atraumatic.  Mouth/Throat: Oropharynx is clear and moist. No oropharyngeal exudate.  Eyes: Conjunctivae are normal. Right eye exhibits no discharge. Left eye exhibits no discharge. No scleral icterus.  Neck: Normal range of motion. Neck supple. No JVD present. No tracheal deviation present. No thyromegaly present.    Cardiovascular: Normal rate, regular rhythm, normal heart sounds and intact distal pulses.  Exam reveals no gallop and no friction rub.   No murmur heard. Pulmonary/Chest: Effort normal and breath sounds normal. No stridor. No respiratory distress. He has no wheezes. He has no rales. He exhibits no tenderness.  Abdominal: Soft. Bowel sounds are normal. He exhibits no distension and no mass. There is no tenderness. There is no rebound and no guarding.  Musculoskeletal: Normal range of motion. He exhibits no edema and no tenderness.  Lymphadenopathy:    He has no cervical adenopathy.  Neurological: He is oriented to person, place, and time.  Skin: Skin is warm and dry. No rash noted. He is not diaphoretic. No erythema. No pallor.     Lab Results  Component Value Date   WBC 7.1 02/19/2013   HGB 15.8 02/19/2013   HCT 45.8 02/19/2013   PLT 216.0 02/19/2013   GLUCOSE 101* 02/19/2013   CHOL 251* 02/19/2013   TRIG 155.0* 02/19/2013   HDL 31.60* 02/19/2013   LDLDIRECT 184.5 02/19/2013   ALT 37 02/19/2013   AST 22 02/19/2013   NA 139 02/19/2013   K 4.6 02/19/2013   CL 106 02/19/2013   CREATININE 1.1 02/19/2013   BUN 12 02/19/2013   CO2 27 02/19/2013   TSH 11.73* 02/19/2013   HGBA1C 5.8 02/19/2013       Assessment & Plan:

## 2013-05-27 NOTE — Assessment & Plan Note (Signed)
His TSH is still elevated so I have increase his synthroid dose

## 2013-05-27 NOTE — Assessment & Plan Note (Signed)
His LDL has improved

## 2013-05-27 NOTE — Patient Instructions (Signed)

## 2013-07-29 ENCOUNTER — Other Ambulatory Visit (INDEPENDENT_AMBULATORY_CARE_PROVIDER_SITE_OTHER): Payer: BC Managed Care – PPO

## 2013-07-29 ENCOUNTER — Ambulatory Visit (INDEPENDENT_AMBULATORY_CARE_PROVIDER_SITE_OTHER): Payer: BC Managed Care – PPO | Admitting: Internal Medicine

## 2013-07-29 ENCOUNTER — Encounter: Payer: Self-pay | Admitting: Internal Medicine

## 2013-07-29 VITALS — BP 136/90 | HR 82 | Temp 98.2°F | Resp 16 | Wt 232.6 lb

## 2013-07-29 DIAGNOSIS — E039 Hypothyroidism, unspecified: Secondary | ICD-10-CM

## 2013-07-29 DIAGNOSIS — N410 Acute prostatitis: Secondary | ICD-10-CM

## 2013-07-29 LAB — URINALYSIS, ROUTINE W REFLEX MICROSCOPIC
BILIRUBIN URINE: NEGATIVE
HGB URINE DIPSTICK: NEGATIVE
Ketones, ur: NEGATIVE
Leukocytes, UA: NEGATIVE
Nitrite: NEGATIVE
Specific Gravity, Urine: 1.01 (ref 1.000–1.030)
Total Protein, Urine: NEGATIVE
URINE GLUCOSE: NEGATIVE
UROBILINOGEN UA: 0.2 (ref 0.0–1.0)
pH: 6 (ref 5.0–8.0)

## 2013-07-29 LAB — BASIC METABOLIC PANEL
BUN: 13 mg/dL (ref 6–23)
CALCIUM: 9.2 mg/dL (ref 8.4–10.5)
CO2: 26 mEq/L (ref 19–32)
CREATININE: 1 mg/dL (ref 0.4–1.5)
Chloride: 105 mEq/L (ref 96–112)
GFR: 87.94 mL/min (ref >60.00)
Glucose, Bld: 123 mg/dL — ABNORMAL HIGH (ref 70–99)
Potassium: 4 mEq/L (ref 3.5–5.1)
Sodium: 138 mEq/L (ref 135–145)

## 2013-07-29 LAB — CBC WITH DIFFERENTIAL/PLATELET
BASOS PCT: 0.1 % (ref 0.0–3.0)
Basophils Absolute: 0 10*3/uL (ref 0.0–0.1)
EOS ABS: 0.1 10*3/uL (ref 0.0–0.7)
Eosinophils Relative: 2.5 % (ref 0.0–5.0)
HEMATOCRIT: 46.2 % (ref 39.0–52.0)
HEMOGLOBIN: 15.9 g/dL (ref 13.0–17.0)
LYMPHS ABS: 2.4 10*3/uL (ref 0.7–4.0)
Lymphocytes Relative: 42.4 % (ref 12.0–46.0)
MCHC: 34.3 g/dL (ref 30.0–36.0)
MCV: 92 fl (ref 78.0–100.0)
Monocytes Absolute: 0.3 10*3/uL (ref 0.1–1.0)
Monocytes Relative: 6 % (ref 3.0–12.0)
NEUTROS ABS: 2.8 10*3/uL (ref 1.4–7.7)
Neutrophils Relative %: 49 % (ref 43.0–77.0)
Platelets: 211 10*3/uL (ref 150.0–400.0)
RBC: 5.02 Mil/uL (ref 4.22–5.81)
RDW: 13 % (ref 11.5–15.5)
WBC: 5.8 10*3/uL (ref 4.0–10.5)

## 2013-07-29 LAB — TSH: TSH: 6.54 u[IU]/mL — ABNORMAL HIGH (ref 0.35–4.50)

## 2013-07-29 MED ORDER — LEVOTHYROXINE SODIUM 100 MCG PO TABS: 100.0000 ug | ORAL_TABLET | Freq: Every day | ORAL | Status: DC

## 2013-07-29 MED ORDER — CIPROFLOXACIN HCL 500 MG PO TABS: 500.0000 mg | ORAL_TABLET | Freq: Two times a day (BID) | ORAL | Status: DC

## 2013-07-29 NOTE — Patient Instructions (Signed)
Prostatitis The prostate gland is about the size and shape of a walnut. It is located just below your bladder. It produces one of the components of semen, which is made up of sperm and the fluids that help nourish and transport it out from the testicles. Prostatitis is inflammation of the prostate gland.  There are four types of prostatitis:  Acute bacterial prostatitis. This is the least common type of prostatitis. It starts quickly and usually is associated with a bladder infection, high fever, and shaking chills. It can occur at any age.  Chronic bacterial prostatitis. This is a persistent bacterial infection in the prostate. It usually develops from repeated acute bacterial prostatitis or acute bacterial prostatitis that was not properly treated. It can occur in men of any age but is most common in middle-aged men whose prostate has begun to enlarge. The symptoms are not as severe as those in acute bacterial prostatitis. Discomfort in the part of your body that is in front of your rectum and below your scrotum (perineum), lower abdomen, or in the head of your penis (glans) may represent your primary discomfort.  Chronic prostatitis (nonbacterial). This is the most common type of prostatitis. It is inflammation of the prostate gland that is not caused by a bacterial infection. The cause is unknown and may be associated with a viral infection or autoimmune disorder.  Prostatodynia (pelvic floor disorder). This is associated with increased muscular tone in the pelvis surrounding the prostate. CAUSES The causes of bacterial prostatitis are bacterial infection. The causes of the other types of prostatitis are unknown.  SYMPTOMS  Symptoms can vary depending upon the type of prostatitis that exists. There can also be overlap in symptoms. Possible symptoms for each type of prostatitis are listed below. Acute Bacterial Prostatitis  Painful urination.  Fever or chills.  Muscle or joint pains.  Low  back pain.  Low abdominal pain.  Inability to empty bladder completely. Chronic Bacterial Prostatitis, Chronic Nonbacterial Prostatitis, and Prostatodynia  Sudden urge to urinate.  Frequent urination.  Difficulty starting urine stream.  Weak urine stream.  Discharge from the urethra.  Dribbling after urination.  Rectal pain.  Pain in the testicles, penis, or tip of the penis.  Pain in the perineum.  Problems with sexual function.  Painful ejaculation.  Bloody semen. DIAGNOSIS  In order to diagnose prostatitis, your health care Hendel Gatliff will ask about your symptoms. One or more urine samples will be taken and tested (urinalysis). If the urinalysis result is negative for bacteria, your health care Zyshawn Bohnenkamp may use a finger to feel your prostate (digital rectal exam). This exam helps your health care Yesenia Fontenette determine if your prostate is swollen and tender. It will also produce a specimen of semen that can be analyzed. TREATMENT  Treatment for prostatitis depends on the cause. If a bacterial infection is the cause, it can be treated with antibiotic medicine. In cases of chronic bacterial prostatitis, the use of antibiotics for up to 1 month or 6 weeks may be necessary. Your health care Mylani Gentry may instruct you to take sitz baths to help relieve pain. A sitz bath is a bath of hot water in which your hips and buttocks are under water. This relaxes the pelvic floor muscles and often helps to relieve the pressure on your prostate. HOME CARE INSTRUCTIONS   Take all medicines as directed by your health care Makyle Eslick.  Take sitz baths as directed by your health care Thalia Turkington. SEEK MEDICAL CARE IF:   Your symptoms   get worse, not better.  You have a fever. SEEK IMMEDIATE MEDICAL CARE IF:   You have chills.  You feel nauseous or vomit.  You feel lightheaded or faint.  You are unable to urinate.  You have blood or blood clots in your urine. MAKE SURE YOU:  Understand  these instructions.  Will watch your condition.  Will get help right away if you are not doing well or get worse. Document Released: 01/26/2000 Document Revised: 02/02/2013 Document Reviewed: 08/17/2012 Exit[employer] Patient Information 2015 [employer]. This information is not intended to replace advice given to you by your health care Anwyn Kriegel. Make sure you discuss any questions you have with your health care Tranae Laramie.  

## 2013-07-29 NOTE — Assessment & Plan Note (Signed)
His labs are normal I will treat the infection with cipro

## 2013-07-29 NOTE — Progress Notes (Signed)
Pre visit review using our clinic review tool, if applicable. No additional management support is needed unless otherwise documented below in the visit note. 

## 2013-07-29 NOTE — Assessment & Plan Note (Signed)
His TSH is a little high so I have increased the dose of his synthroid

## 2013-07-29 NOTE — Progress Notes (Signed)
Subjective:    Patient ID: Travis Sawyer, male    DOB: 04/05/1975, 38 y.o.   MRN: [id number]  HPI Comments: He returns c/o a 1 week history of frequency and weak urine stream with aching in his groin.     Review of Systems  Constitutional: Negative.  Negative for fever, chills, diaphoresis, appetite change and fatigue.  HENT: Negative.   Eyes: Negative.   Respiratory: Negative.  Negative for apnea, cough, choking, chest tightness, shortness of breath, wheezing and stridor.   Cardiovascular: Negative.  Negative for chest pain, palpitations and leg swelling.  Gastrointestinal: Positive for abdominal pain (he has diffsue aching in his right groin). Negative for nausea, vomiting, diarrhea, constipation, blood in stool, abdominal distention, anal bleeding and rectal pain.  Endocrine: Negative.   Genitourinary: Positive for dysuria, frequency and difficulty urinating. Negative for urgency, hematuria, flank pain, decreased urine volume, discharge, penile swelling, scrotal swelling, enuresis, genital sores, penile pain and testicular pain.       He complains of weak urine stream  Musculoskeletal: Negative.  Negative for arthralgias and back pain.  Skin: Negative.  Negative for rash.  Allergic/Immunologic: Negative.   Neurological: Negative.   Hematological: Negative.  Negative for adenopathy. Does not bruise/bleed easily.  Psychiatric/Behavioral: Negative.        Objective:   Physical Exam  Vitals reviewed. Constitutional: He is oriented to person, place, and time. He appears well-developed and well-nourished.  Non-toxic appearance. He does not have a sickly appearance. He does not appear ill. No distress.  HENT:  Head: Normocephalic and atraumatic.  Mouth/Throat: Oropharynx is clear and moist. No oropharyngeal exudate.  Eyes: Conjunctivae are normal. Right eye exhibits no discharge. Left eye exhibits no discharge. No scleral icterus.  Neck: Normal range of motion. Neck supple. No JVD  present. No tracheal deviation present. No thyromegaly present.  Cardiovascular: Normal rate, regular rhythm, normal heart sounds and intact distal pulses.  Exam reveals no gallop and no friction rub.   No murmur heard. Pulmonary/Chest: Effort normal and breath sounds normal. No stridor. No respiratory distress. He has no wheezes. He has no rales. He exhibits no tenderness.  Abdominal: Soft. Normal appearance and bowel sounds are normal. He exhibits no shifting dullness, no distension, no pulsatile liver, no fluid wave, no abdominal bruit, no ascites, no pulsatile midline mass and no mass. There is no hepatosplenomegaly. There is no tenderness. There is no rebound, no guarding and no CVA tenderness. Hernia confirmed negative in the right inguinal area and confirmed negative in the left inguinal area.  Genitourinary: Rectum normal, testes normal and penis normal. Rectal exam shows no external hemorrhoid, no internal hemorrhoid, no fissure, no mass, no tenderness and anal tone normal. Guaiac negative stool. Prostate is enlarged (1+ symm BPH) and tender (diffusely boggy). Right testis shows no mass, no swelling and no tenderness. Right testis is descended. Left testis shows no mass, no swelling and no tenderness. Left testis is descended. Circumcised. No penile erythema or penile tenderness. No discharge found.  Musculoskeletal: Normal range of motion. He exhibits no edema and no tenderness.  Lymphadenopathy:    He has no cervical adenopathy.       Right: No inguinal adenopathy present.       Left: No inguinal adenopathy present.  Neurological: He is oriented to person, place, and time.  Skin: Skin is warm and dry. No rash noted. He is not diaphoretic. No erythema. No pallor.  Psychiatric: He has a normal mood and affect. His behavior is  normal. Judgment and thought content normal.     Lab Results  Component Value Date   WBC 7.1 02/19/2013   HGB 15.8 02/19/2013   HCT 45.8 02/19/2013   PLT 216.0 02/19/2013    GLUCOSE 92 05/27/2013   CHOL 228* 05/27/2013   TRIG 245.0* 05/27/2013   HDL 31.70* 05/27/2013   LDLDIRECT 184.5 02/19/2013   LDLCALC 147* 05/27/2013   ALT 37 02/19/2013   AST 22 02/19/2013   NA 136 05/27/2013   K 4.3 05/27/2013   CL 101 05/27/2013   CREATININE 1.1 05/27/2013   BUN 11 05/27/2013   CO2 27 05/27/2013   TSH 13.63* 05/27/2013   HGBA1C 5.8 02/19/2013       Assessment & Plan:

## 2013-07-29 NOTE — Progress Notes (Signed)
Mailed AVS since patient left without it

## 2013-09-27 ENCOUNTER — Telehealth: Payer: Self-pay | Admitting: Internal Medicine

## 2013-09-27 DIAGNOSIS — E039 Hypothyroidism, unspecified: Secondary | ICD-10-CM

## 2013-09-27 NOTE — Telephone Encounter (Signed)
Patient states that he is scheduled to have surgery on September 24th and wants to know if he can come into the lab to get an updated TSH which he needs to have 4 weeks prior to the surgery. Next avail OV not until 10/22/13 and patient says he has to be able to get lab done before this date. Can patient have lab done without OV?   Please advise.

## 2013-09-27 NOTE — Telephone Encounter (Signed)
tsh ordered.

## 2013-09-30 ENCOUNTER — Ambulatory Visit: Payer: BC Managed Care – PPO | Admitting: Internal Medicine

## 2014-01-28 ENCOUNTER — Other Ambulatory Visit: Payer: Self-pay | Admitting: Internal Medicine

## 2014-03-29 ENCOUNTER — Other Ambulatory Visit: Payer: Self-pay | Admitting: Internal Medicine

## 2014-04-14 ENCOUNTER — Ambulatory Visit (INDEPENDENT_AMBULATORY_CARE_PROVIDER_SITE_OTHER): Payer: BLUE CROSS/BLUE SHIELD | Admitting: Internal Medicine

## 2014-04-14 ENCOUNTER — Encounter: Payer: Self-pay | Admitting: Internal Medicine

## 2014-04-14 VITALS — BP 120/60 | HR 68 | Temp 97.8°F | Resp 16 | Ht 68.0 in | Wt 235.0 lb

## 2014-04-14 DIAGNOSIS — E038 Other specified hypothyroidism: Secondary | ICD-10-CM

## 2014-04-14 DIAGNOSIS — Z0000 Encounter for general adult medical examination without abnormal findings: Secondary | ICD-10-CM

## 2014-04-14 DIAGNOSIS — R739 Hyperglycemia, unspecified: Secondary | ICD-10-CM

## 2014-04-14 NOTE — Progress Notes (Signed)
Subjective:    Patient ID: Travis Sawyer, male    DOB: 1976/01/25, 39 y.o.   MRN: [id number]  Thyroid Problem Presents for follow-up visit. Symptoms include fatigue and weight gain. Patient reports no anxiety, cold intolerance, constipation, depressed mood, diaphoresis, diarrhea, dry skin, hair loss, heat intolerance, hoarse voice, leg swelling, nail problem, palpitations, tremors, visual change or weight loss. The symptoms have been stable. Past treatments include levothyroxine. The treatment provided mild relief.      Review of Systems  Constitutional: Positive for weight gain and fatigue. Negative for fever, chills, weight loss, diaphoresis, activity change, appetite change and unexpected weight change.  HENT: Negative.  Negative for hoarse voice.   Eyes: Negative.   Respiratory: Negative.  Negative for cough, choking, chest tightness, shortness of breath and stridor.   Cardiovascular: Negative.  Negative for chest pain, palpitations and leg swelling.  Gastrointestinal: Negative.  Negative for abdominal pain, diarrhea and constipation.  Endocrine: Negative.  Negative for cold intolerance, heat intolerance, polyphagia and polyuria.  Genitourinary: Negative.  Negative for difficulty urinating.  Musculoskeletal: Negative.   Skin: Negative.   Allergic/Immunologic: Negative.   Neurological: Negative.  Negative for tremors.  Hematological: Negative.  Negative for adenopathy. Does not bruise/bleed easily.  Psychiatric/Behavioral: Negative.  The patient is not nervous/anxious.        Objective:   Physical Exam  Constitutional: He is oriented to person, place, and time. He appears well-developed and well-nourished.  HENT:  Head: Normocephalic and atraumatic.  Mouth/Throat: Oropharynx is clear and moist. No oropharyngeal exudate.  Eyes: Conjunctivae are normal. Right eye exhibits no discharge. Left eye exhibits no discharge. No scleral icterus.  Neck: Normal range of motion. Neck supple.  No JVD present. No tracheal deviation present. No thyromegaly present.  Cardiovascular: Normal rate, regular rhythm and intact distal pulses.  Exam reveals no gallop and no friction rub.   No murmur heard. Pulmonary/Chest: Effort normal and breath sounds normal. No stridor. No respiratory distress. He has no wheezes. He has no rales. He exhibits no tenderness.  Abdominal: Soft. Bowel sounds are normal. He exhibits no distension and no mass. There is no tenderness. There is no rebound and no guarding. Hernia confirmed negative in the right inguinal area and confirmed negative in the left inguinal area.  Genitourinary: Testes normal and penis normal. Right testis shows no mass, no swelling and no tenderness. Right testis is descended. Left testis shows no mass, no swelling and no tenderness. Left testis is descended. Circumcised. No penile erythema or penile tenderness. No discharge found.  Musculoskeletal: Normal range of motion. He exhibits no edema or tenderness.  Lymphadenopathy:    He has no cervical adenopathy.       Right: No inguinal adenopathy present.       Left: No inguinal adenopathy present.  Neurological: He is oriented to person, place, and time.  Skin: Skin is warm and dry. No rash noted. He is not diaphoretic. No erythema. No pallor.  Psychiatric: He has a normal mood and affect. His behavior is normal. Judgment and thought content normal.  Vitals reviewed.    Lab Results  Component Value Date   WBC 5.8 07/29/2013   HGB 15.9 07/29/2013   HCT 46.2 07/29/2013   PLT 211.0 07/29/2013   GLUCOSE 123* 07/29/2013   CHOL 228* 05/27/2013   TRIG 245.0* 05/27/2013   HDL 31.70* 05/27/2013   LDLDIRECT 184.5 02/19/2013   LDLCALC 147* 05/27/2013   ALT 37 02/19/2013   AST 22 02/19/2013  NA 138 07/29/2013   K 4.0 07/29/2013   CL 105 07/29/2013   CREATININE 1.0 07/29/2013   BUN 13 07/29/2013   CO2 26 07/29/2013   TSH 6.54* 07/29/2013   HGBA1C 5.8 02/19/2013       Assessment &  Plan:

## 2014-04-14 NOTE — Progress Notes (Signed)
Pre visit review using our clinic review tool, if applicable. No additional management support is needed unless otherwise documented below in the visit note. 

## 2014-04-14 NOTE — Patient Instructions (Signed)

## 2014-04-15 NOTE — Assessment & Plan Note (Signed)
I will recheck his TSh and will adjust his dose if needed 

## 2014-04-15 NOTE — Assessment & Plan Note (Signed)
He refused a flu vax Exam done Labs ordered Pt ed material was given 

## 2014-04-19 ENCOUNTER — Other Ambulatory Visit (INDEPENDENT_AMBULATORY_CARE_PROVIDER_SITE_OTHER): Payer: BLUE CROSS/BLUE SHIELD

## 2014-04-19 DIAGNOSIS — Z0000 Encounter for general adult medical examination without abnormal findings: Secondary | ICD-10-CM

## 2014-04-19 DIAGNOSIS — R739 Hyperglycemia, unspecified: Secondary | ICD-10-CM

## 2014-04-19 LAB — CBC WITH DIFFERENTIAL/PLATELET
BASOS ABS: 0 10*3/uL (ref 0.0–0.1)
Basophils Relative: 0.3 % (ref 0.0–3.0)
Eosinophils Absolute: 0.1 10*3/uL (ref 0.0–0.7)
Eosinophils Relative: 2.3 % (ref 0.0–5.0)
HCT: 48.3 % (ref 39.0–52.0)
HEMOGLOBIN: 16.9 g/dL (ref 13.0–17.0)
LYMPHS ABS: 2.5 10*3/uL (ref 0.7–4.0)
Lymphocytes Relative: 42.2 % (ref 12.0–46.0)
MCHC: 35 g/dL (ref 30.0–36.0)
MCV: 90.1 fl (ref 78.0–100.0)
Monocytes Absolute: 0.4 10*3/uL (ref 0.1–1.0)
Monocytes Relative: 6.4 % (ref 3.0–12.0)
Neutro Abs: 2.9 10*3/uL (ref 1.4–7.7)
Neutrophils Relative %: 48.8 % (ref 43.0–77.0)
PLATELETS: 224 10*3/uL (ref 150.0–400.0)
RBC: 5.36 Mil/uL (ref 4.22–5.81)
RDW: 12.7 % (ref 11.5–15.5)
WBC: 5.9 10*3/uL (ref 4.0–10.5)

## 2014-04-19 LAB — LIPID PANEL
CHOL/HDL RATIO: 7
Cholesterol: 223 mg/dL — ABNORMAL HIGH (ref 0–200)
HDL: 31.5 mg/dL — AB (ref >39.00)
LDL CALC: 154 mg/dL — AB (ref 0–99)
NonHDL: 191.5
TRIGLYCERIDES: 189 mg/dL — AB (ref 0.0–149.0)
VLDL: 37.8 mg/dL (ref 0.0–40.0)

## 2014-04-19 LAB — HEMOGLOBIN A1C: Hgb A1c MFr Bld: 5.7 % (ref 4.6–6.5)

## 2014-04-19 LAB — URINALYSIS, ROUTINE W REFLEX MICROSCOPIC
Bilirubin Urine: NEGATIVE
HGB URINE DIPSTICK: NEGATIVE
Ketones, ur: NEGATIVE
Leukocytes, UA: NEGATIVE
NITRITE: NEGATIVE
SPECIFIC GRAVITY, URINE: 1.02 (ref 1.000–1.030)
TOTAL PROTEIN, URINE-UPE24: NEGATIVE
Urine Glucose: NEGATIVE
Urobilinogen, UA: 0.2 (ref 0.0–1.0)
WBC, UA: NONE SEEN (ref 0–?)
pH: 6 (ref 5.0–8.0)

## 2014-04-19 LAB — COMPREHENSIVE METABOLIC PANEL
ALK PHOS: 37 U/L — AB (ref 39–117)
ALT: 34 U/L (ref 0–53)
AST: 21 U/L (ref 0–37)
Albumin: 4.6 g/dL (ref 3.5–5.2)
BUN: 13 mg/dL (ref 6–23)
CO2: 30 mEq/L (ref 19–32)
CREATININE: 1.17 mg/dL (ref 0.40–1.50)
Calcium: 9.6 mg/dL (ref 8.4–10.5)
Chloride: 102 mEq/L (ref 96–112)
GFR: 74.76 mL/min (ref >60.00)
Glucose, Bld: 103 mg/dL — ABNORMAL HIGH (ref 70–99)
POTASSIUM: 4.2 meq/L (ref 3.5–5.1)
Sodium: 137 mEq/L (ref 135–145)
Total Bilirubin: 0.8 mg/dL (ref 0.2–1.2)
Total Protein: 7.3 g/dL (ref 6.0–8.3)

## 2014-04-19 LAB — TSH: TSH: 10.43 u[IU]/mL — AB (ref 0.35–4.50)

## 2014-04-20 ENCOUNTER — Other Ambulatory Visit: Payer: Self-pay | Admitting: Internal Medicine

## 2014-04-20 ENCOUNTER — Encounter: Payer: Self-pay | Admitting: Internal Medicine

## 2014-04-20 DIAGNOSIS — E038 Other specified hypothyroidism: Secondary | ICD-10-CM

## 2014-04-20 MED ORDER — LEVOTHYROXINE SODIUM 50 MCG PO TABS: 50.0000 ug | ORAL_TABLET | Freq: Every day | ORAL | Status: DC

## 2014-09-30 ENCOUNTER — Other Ambulatory Visit: Payer: Self-pay | Admitting: Internal Medicine

## 2014-10-31 ENCOUNTER — Other Ambulatory Visit: Payer: Self-pay | Admitting: Internal Medicine

## 2014-10-31 ENCOUNTER — Telehealth: Payer: Self-pay | Admitting: Internal Medicine

## 2014-10-31 NOTE — Telephone Encounter (Signed)
I scheduled an appt for Wednesday for patient. He is out of his medication. Can you prescribe enough for him

## 2014-11-02 ENCOUNTER — Other Ambulatory Visit (INDEPENDENT_AMBULATORY_CARE_PROVIDER_SITE_OTHER): Payer: BLUE CROSS/BLUE SHIELD

## 2014-11-02 ENCOUNTER — Ambulatory Visit (INDEPENDENT_AMBULATORY_CARE_PROVIDER_SITE_OTHER): Payer: BLUE CROSS/BLUE SHIELD | Admitting: Internal Medicine

## 2014-11-02 ENCOUNTER — Encounter: Payer: Self-pay | Admitting: Internal Medicine

## 2014-11-02 VITALS — BP 128/88 | HR 69 | Temp 97.6°F | Resp 16 | Ht 68.0 in | Wt 218.0 lb

## 2014-11-02 DIAGNOSIS — E038 Other specified hypothyroidism: Secondary | ICD-10-CM | POA: Diagnosis not present

## 2014-11-02 DIAGNOSIS — K760 Fatty (change of) liver, not elsewhere classified: Secondary | ICD-10-CM

## 2014-11-02 DIAGNOSIS — B360 Pityriasis versicolor: Secondary | ICD-10-CM | POA: Diagnosis not present

## 2014-11-02 LAB — COMPREHENSIVE METABOLIC PANEL
ALBUMIN: 4.5 g/dL (ref 3.5–5.2)
ALT: 21 U/L (ref 0–53)
AST: 18 U/L (ref 0–37)
Alkaline Phosphatase: 39 U/L (ref 39–117)
BILIRUBIN TOTAL: 0.6 mg/dL (ref 0.2–1.2)
BUN: 11 mg/dL (ref 6–23)
CALCIUM: 9.3 mg/dL (ref 8.4–10.5)
CHLORIDE: 103 meq/L (ref 96–112)
CO2: 29 mEq/L (ref 19–32)
CREATININE: 1.11 mg/dL (ref 0.40–1.50)
GFR: 79.2 mL/min (ref >60.00)
Glucose, Bld: 108 mg/dL — ABNORMAL HIGH (ref 70–99)
Potassium: 3.7 mEq/L (ref 3.5–5.1)
Sodium: 139 mEq/L (ref 135–145)
Total Protein: 7.2 g/dL (ref 6.0–8.3)

## 2014-11-02 LAB — TSH: TSH: 3.04 u[IU]/mL (ref 0.35–4.50)

## 2014-11-02 MED ORDER — LEVOTHYROXINE SODIUM 150 MCG PO TABS: 150.0000 ug | ORAL_TABLET | Freq: Every day | ORAL | Status: DC

## 2014-11-02 MED ORDER — KETOCONAZOLE 200 MG PO TABS: 200.0000 mg | ORAL_TABLET | Freq: Every day | ORAL | Status: AC

## 2014-11-02 NOTE — Progress Notes (Signed)
Pre visit review using our clinic review tool, if applicable. No additional management support is needed unless otherwise documented below in the visit note. 

## 2014-11-02 NOTE — Progress Notes (Signed)
Subjective:  Patient ID: Travis Sawyer, male    DOB: July 22, 1975  Age: 39 y.o. MRN: [id number]  CC: Hypothyroidism and Rash   HPI Travis Sawyer presents for f/up on hypoT but he also complains of rash on his torso and arms for several months. It is not symptomatic and he has not treated it.  Outpatient Prescriptions Prior to Visit  Medication Sig Dispense Refill  . levothyroxine (SYNTHROID, LEVOTHROID) 100 MCG tablet TAKE 1 TABLET ONCE DAILY. 30 tablet 0  . levothyroxine (SYNTHROID, LEVOTHROID) 50 MCG tablet Take 1 tablet (50 mcg total) by mouth daily. 90 tablet 1   No facility-administered medications prior to visit.    ROS Review of Systems  Constitutional: Negative.  Negative for fever, chills, diaphoresis, appetite change, fatigue and unexpected weight change.  HENT: Negative.   Eyes: Negative.   Respiratory: Negative.  Negative for cough, choking, chest tightness, shortness of breath and stridor.   Cardiovascular: Negative.  Negative for chest pain, palpitations and leg swelling.  Gastrointestinal: Negative.  Negative for nausea, vomiting, abdominal pain, diarrhea and constipation.  Endocrine: Negative.   Genitourinary: Negative.   Musculoskeletal: Negative.  Negative for back pain and neck pain.  Skin: Positive for rash. Negative for color change, pallor and wound.  Allergic/Immunologic: Negative.   Neurological: Negative.   Hematological: Negative.  Negative for adenopathy. Does not bruise/bleed easily.  Psychiatric/Behavioral: Negative.     Objective:  BP 128/88 mmHg  Pulse 69  Temp(Src) 97.6 F (36.4 C) (Oral)  Resp 16  Ht  (1.727 m)  Wt 218 lb (98.884 kg)  BMI 33.15 kg/m2  SpO2 97%  BP Readings from Last 3 Encounters:  11/02/14 128/88  04/14/14 120/60  07/29/13 136/90    Wt Readings from Last 3 Encounters:  11/02/14 218 lb (98.884 kg)  04/14/14 235 lb (106.595 kg)  07/29/13 232 lb 9.6 oz (105.507 kg)    Physical Exam  Constitutional: No  distress.  HENT:  Mouth/Throat: Oropharynx is clear and moist. No oropharyngeal exudate.  Eyes: Conjunctivae are normal. Right eye exhibits no discharge. Left eye exhibits no discharge. No scleral icterus.  Neck: Normal range of motion. Neck supple. No JVD present. No tracheal deviation present. No thyromegaly present.  Cardiovascular: Normal rate, regular rhythm, normal heart sounds and intact distal pulses.  Exam reveals no gallop and no friction rub.   No murmur heard. Pulmonary/Chest: Effort normal and breath sounds normal. No stridor. No respiratory distress. He has no wheezes. He has no rales. He exhibits no tenderness.  Abdominal: Soft. Bowel sounds are normal. He exhibits no distension and no mass. There is no tenderness. There is no rebound and no guarding.  Musculoskeletal: Normal range of motion. He exhibits no edema or tenderness.  Lymphadenopathy:    He has no cervical adenopathy.  Skin: Skin is warm and dry. Rash noted. No abrasion, no bruising, no lesion, no petechiae and no purpura noted. Rash is macular. Rash is not papular, not maculopapular, not nodular, not pustular, not vesicular and not urticarial. He is not diaphoretic. No erythema. No pallor.  There are round and polygonal hypopigmented macules symmetrically over the upper torso and both upper arms, there is no scale/papules  Psychiatric: He has a normal mood and affect. His behavior is normal. Judgment and thought content normal.  Vitals reviewed.   Lab Results  Component Value Date   WBC 5.9 04/19/2014   HGB 16.9 04/19/2014   HCT 48.3 04/19/2014   PLT 224.0 04/19/2014   GLUCOSE  108* 11/02/2014   CHOL 223* 04/19/2014   TRIG 189.0* 04/19/2014   HDL 31.50* 04/19/2014   LDLDIRECT 184.5 02/19/2013   LDLCALC 154* 04/19/2014   ALT 21 11/02/2014   AST 18 11/02/2014   NA 139 11/02/2014   K 3.7 11/02/2014   CL 103 11/02/2014   CREATININE 1.11 11/02/2014   BUN 11 11/02/2014   CO2 29 11/02/2014   TSH 3.04  11/02/2014   HGBA1C 5.7 04/19/2014    Patient was never admitted.  Assessment & Plan:   Travis Sawyer was seen today for hypothyroidism and rash.  Diagnoses and all orders for this visit:  Other specified hypothyroidism- his TSH is in the normal range, will stay on the current dose -     TSH; Future -     levothyroxine (SYNTHROID, LEVOTHROID) 150 MCG tablet; Take 1 tablet (150 mcg total) by mouth daily.  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  Other orders -     Cancel: levothyroxine (SYNTHROID, LEVOTHROID) 100 MCG tablet; Take 1 tablet (100 mcg total) by mouth daily. -     Cancel: levothyroxine (SYNTHROID, LEVOTHROID) 50 MCG tablet; Take 1 tablet (50 mcg total) by mouth daily.  I have discontinued Travis Sawyer levothyroxine and levothyroxine. I am also having him start on ketoconazole and levothyroxine.  Meds ordered this encounter  Medications  . ketoconazole (NIZORAL) 200 MG tablet    Sig: Take 1 tablet (200 mg total) by mouth daily.    Dispense:  7 tablet    Refill:  0  . levothyroxine (SYNTHROID, LEVOTHROID) 150 MCG tablet    Sig: Take 1 tablet (150 mcg total) by mouth daily.    Dispense:  90 tablet    Refill:  1     Follow-up: Return in about 4 months (around 03/04/2015).  Sanda Linger, MD

## 2014-11-02 NOTE — Patient Instructions (Signed)
Tinea Versicolor Tinea versicolor is a common yeast infection of the skin. This condition becomes known when the yeast on our skin starts to overgrow (yeast is a normal inhabitant on our skin). This condition is noticed as white or light brown patches on brown skin, and is more evident in the summer on tanned skin. These areas are slightly scaly if scratched. The light patches from the yeast become evident when the yeast creates "holes in your suntan". This is most often noticed in the summer. The patches are usually located on the chest, back, pubis, neck and body folds. However, it may occur on any area of body. Mild itching and inflammation (redness or soreness) may be present. DIAGNOSIS  The diagnosisof this is made clinically (by looking). Cultures from samples are usually not needed. Examination under the microscope may help. However, yeast is normally found on skin. The diagnosis still remains clinical. Examination under [employer]'s Ultraviolet Light can determine the extent of the infection. TREATMENT  This common infection is usually only of cosmetic (only a concern to your appearance). It is easily treated with dandruff shampoo used during showers or bathing. Vigorous scrubbing will eliminate the yeast over several days time. The light areas in your skin may remain for weeks or months after the infection is cured unless your skin is exposed to sunlight. The lighter or darker spots caused by the fungus that remain after complete treatment are not a sign of treatment failure; it will take a long time to resolve. Your caregiver may recommend a number of commercial preparations or medication by mouth if home care is not working. Recurrence is common and preventative medication may be necessary. This skin condition is not highly contagious. Special care is not needed to protect close friends and family members. Normal hygiene is usually enough. Follow up is required only if you develop complications (such as a  secondary infection from scratching), if recommended by your caregiver, or if no relief is obtained from the preparations used. Document Released: 01/26/2000 Document Revised: 04/22/2011 Document Reviewed: 03/09/2008 Exit[employer] Patient Information 2015 [employer]. This information is not intended to replace advice given to you by your health care Benton Tooker. Make sure you discuss any questions you have with your health care Jidenna Figgs.  

## 2015-05-01 ENCOUNTER — Other Ambulatory Visit: Payer: Self-pay | Admitting: Internal Medicine

## 2015-05-04 ENCOUNTER — Other Ambulatory Visit (INDEPENDENT_AMBULATORY_CARE_PROVIDER_SITE_OTHER): Payer: BLUE CROSS/BLUE SHIELD

## 2015-05-04 ENCOUNTER — Encounter: Payer: Self-pay | Admitting: Internal Medicine

## 2015-05-04 ENCOUNTER — Ambulatory Visit (INDEPENDENT_AMBULATORY_CARE_PROVIDER_SITE_OTHER): Payer: BLUE CROSS/BLUE SHIELD | Admitting: Internal Medicine

## 2015-05-04 VITALS — BP 116/88 | HR 73 | Temp 98.1°F | Resp 16 | Ht 68.0 in | Wt 219.2 lb

## 2015-05-04 DIAGNOSIS — K760 Fatty (change of) liver, not elsewhere classified: Secondary | ICD-10-CM

## 2015-05-04 DIAGNOSIS — E782 Mixed hyperlipidemia: Secondary | ICD-10-CM

## 2015-05-04 DIAGNOSIS — Z0000 Encounter for general adult medical examination without abnormal findings: Secondary | ICD-10-CM | POA: Diagnosis not present

## 2015-05-04 DIAGNOSIS — R739 Hyperglycemia, unspecified: Secondary | ICD-10-CM | POA: Diagnosis not present

## 2015-05-04 DIAGNOSIS — E038 Other specified hypothyroidism: Secondary | ICD-10-CM

## 2015-05-04 LAB — URINALYSIS, ROUTINE W REFLEX MICROSCOPIC
Bilirubin Urine: NEGATIVE
Hgb urine dipstick: NEGATIVE
KETONES UR: NEGATIVE
Leukocytes, UA: NEGATIVE
NITRITE: NEGATIVE
PH: 6 (ref 5.0–8.0)
RBC / HPF: NONE SEEN (ref 0–?)
Total Protein, Urine: NEGATIVE
URINE GLUCOSE: NEGATIVE
Urobilinogen, UA: 0.2 (ref 0.0–1.0)
WBC, UA: NONE SEEN (ref 0–?)

## 2015-05-04 LAB — CBC WITH DIFFERENTIAL/PLATELET
BASOS ABS: 0 10*3/uL (ref 0.0–0.1)
BASOS PCT: 0.3 % (ref 0.0–3.0)
EOS ABS: 0.1 10*3/uL (ref 0.0–0.7)
Eosinophils Relative: 1.3 % (ref 0.0–5.0)
HCT: 46.8 % (ref 39.0–52.0)
Hemoglobin: 16.3 g/dL (ref 13.0–17.0)
LYMPHS ABS: 1.9 10*3/uL (ref 0.7–4.0)
LYMPHS PCT: 29.5 % (ref 12.0–46.0)
MCHC: 34.8 g/dL (ref 30.0–36.0)
MCV: 88.8 fl (ref 78.0–100.0)
MONO ABS: 0.3 10*3/uL (ref 0.1–1.0)
Monocytes Relative: 5.4 % (ref 3.0–12.0)
NEUTROS ABS: 4.1 10*3/uL (ref 1.4–7.7)
NEUTROS PCT: 63.5 % (ref 43.0–77.0)
PLATELETS: 224 10*3/uL (ref 150.0–400.0)
RBC: 5.27 Mil/uL (ref 4.22–5.81)
RDW: 12.5 % (ref 11.5–15.5)
WBC: 6.4 10*3/uL (ref 4.0–10.5)

## 2015-05-04 LAB — COMPREHENSIVE METABOLIC PANEL
ALT: 25 U/L (ref 0–53)
AST: 19 U/L (ref 0–37)
Albumin: 4.7 g/dL (ref 3.5–5.2)
Alkaline Phosphatase: 37 U/L — ABNORMAL LOW (ref 39–117)
BILIRUBIN TOTAL: 0.7 mg/dL (ref 0.2–1.2)
BUN: 13 mg/dL (ref 6–23)
CALCIUM: 9.7 mg/dL (ref 8.4–10.5)
CHLORIDE: 102 meq/L (ref 96–112)
CO2: 29 meq/L (ref 19–32)
CREATININE: 1.18 mg/dL (ref 0.40–1.50)
GFR: 73.61 mL/min (ref >60.00)
GLUCOSE: 102 mg/dL — AB (ref 70–99)
Potassium: 4.4 mEq/L (ref 3.5–5.1)
SODIUM: 138 meq/L (ref 135–145)
Total Protein: 7.6 g/dL (ref 6.0–8.3)

## 2015-05-04 LAB — LIPID PANEL
CHOL/HDL RATIO: 6
Cholesterol: 211 mg/dL — ABNORMAL HIGH (ref 0–200)
HDL: 36.8 mg/dL — ABNORMAL LOW (ref >39.00)
LDL Cholesterol: 148 mg/dL — ABNORMAL HIGH (ref 0–99)
NonHDL: 174.61
TRIGLYCERIDES: 132 mg/dL (ref 0.0–149.0)
VLDL: 26.4 mg/dL (ref 0.0–40.0)

## 2015-05-04 LAB — HIV ANTIBODY (ROUTINE TESTING W REFLEX): HIV: NONREACTIVE

## 2015-05-04 LAB — TSH: TSH: 1.81 u[IU]/mL (ref 0.35–4.50)

## 2015-05-04 LAB — HEMOGLOBIN A1C: HEMOGLOBIN A1C: 5.7 % (ref 4.6–6.5)

## 2015-05-04 NOTE — Progress Notes (Signed)
Pre visit review using our clinic review tool, if applicable. No additional management support is needed unless otherwise documented below in the visit note. (sarah self, [employer] student  documented this record)  

## 2015-05-04 NOTE — Patient Instructions (Signed)

## 2015-05-04 NOTE — Progress Notes (Signed)
Subjective:  Patient ID: Travis Sawyer, male    DOB: 05/18/1975  Age: 40 y.o. MRN: [id number]  CC: Hypothyroidism and Annual Exam   HPI Travis Sawyer presents for a complete physical as well as follow-up on hypothyroidism and hyperlipidemia.  He feels like his thyroid level is normal, he denies constipation/weight changes/changes in his skin texture/changes in his appetite/edema/fatigue.  He is working on his lifestyle modifications to lower his cholesterol and triglyceride levels. He has had some success with this.  Outpatient Prescriptions Prior to Visit  Medication Sig Dispense Refill  . levothyroxine (SYNTHROID, LEVOTHROID) 150 MCG tablet TAKE 1 TABLET ONCE DAILY. 90 tablet 1   No facility-administered medications prior to visit.    ROS Review of Systems  Constitutional: Negative.  Negative for unexpected weight change.  HENT: Negative.   Eyes: Negative.   Respiratory: Negative.  Negative for cough, choking, shortness of breath and stridor.   Cardiovascular: Negative.  Negative for chest pain, palpitations and leg swelling.  Gastrointestinal: Negative.  Negative for nausea, vomiting, abdominal pain, diarrhea and constipation.  Endocrine: Negative.   Genitourinary: Negative.   Musculoskeletal: Negative.   Skin: Negative.   Allergic/Immunologic: Negative.   Neurological: Negative.  Negative for dizziness and weakness.  Hematological: Negative.   Psychiatric/Behavioral: Negative.     Objective:  BP 116/88 mmHg  Pulse 73  Temp(Src) 98.1 F (36.7 C) (Oral)  Ht 5\' 8"  (1.727 m)  Wt 219 lb 4 oz (99.451 kg)  BMI 33.34 kg/m2  SpO2 97%  BP Readings from Last 3 Encounters:  05/04/15 116/88  11/02/14 128/88  04/14/14 120/60    Wt Readings from Last 3 Encounters:  05/04/15 219 lb 4 oz (99.451 kg)  11/02/14 218 lb (98.884 kg)  04/14/14 235 lb (106.595 kg)    Physical Exam  Constitutional: He is oriented to person, place, and time. He appears well-developed and  well-nourished. No distress.  HENT:  Head: Normocephalic and atraumatic.  Mouth/Throat: Oropharynx is clear and moist. No oropharyngeal exudate.  Eyes: Conjunctivae are normal. Right eye exhibits no discharge. Left eye exhibits no discharge. No scleral icterus.  Neck: Normal range of motion. Neck supple. No JVD present. No tracheal deviation present. No thyromegaly present.  Cardiovascular: Normal rate, regular rhythm, normal heart sounds and intact distal pulses.  Exam reveals no gallop and no friction rub.   No murmur heard. Pulmonary/Chest: Effort normal and breath sounds normal. No stridor. No respiratory distress. He has no wheezes. He has no rales. He exhibits no tenderness.  Abdominal: Soft. Bowel sounds are normal. He exhibits no distension and no mass. There is no tenderness. There is no rebound and no guarding. Hernia confirmed negative in the right inguinal area and confirmed negative in the left inguinal area.  Genitourinary: Rectum normal, prostate normal, testes normal and penis normal. Right testis shows no mass, no swelling and no tenderness. Right testis is descended. Left testis shows no mass, no swelling and no tenderness. Left testis is descended. Circumcised. No penile erythema or penile tenderness. No discharge found.  Musculoskeletal: Normal range of motion. He exhibits no edema or tenderness.  Lymphadenopathy:    He has no cervical adenopathy.       Right: No inguinal adenopathy present.       Left: No inguinal adenopathy present.  Neurological: He is oriented to person, place, and time.  Skin: Skin is warm and dry. No rash noted. He is not diaphoretic. No erythema. No pallor.  Psychiatric: He has a normal mood  and affect. His behavior is normal. Judgment and thought content normal.  Vitals reviewed.   Lab Results  Component Value Date   WBC 6.4 05/04/2015   HGB 16.3 05/04/2015   HCT 46.8 05/04/2015   PLT 224.0 05/04/2015   GLUCOSE 102* 05/04/2015   CHOL 211*  05/04/2015   TRIG 132.0 05/04/2015   HDL 36.80* 05/04/2015   LDLDIRECT 184.5 02/19/2013   LDLCALC 148* 05/04/2015   ALT 25 05/04/2015   AST 19 05/04/2015   NA 138 05/04/2015   K 4.4 05/04/2015   CL 102 05/04/2015   CREATININE 1.18 05/04/2015   BUN 13 05/04/2015   CO2 29 05/04/2015   TSH 1.81 05/04/2015   HGBA1C 5.7 05/04/2015    Patient was never admitted.  Assessment & Plan:   Demarious was seen today for hypothyroidism and annual exam.  Diagnoses and all orders for this visit:  Hyperglycemia- he is prediabetic, no medications are needed at this time, he will continue to work on his lifestyle modifications. -     Hemoglobin A1c; Future  Routine general medical examination at a health care facility- he refused a flu vaccine, exam completed, labs ordered and reviewed, patient education material was given. -     Lipid panel; Future -     Comprehensive metabolic panel; Future -     CBC with Differential/Platelet; Future -     TSH; Future -     Urinalysis, Routine w reflex microscopic (not at [facility]); Future -     HIV antibody; Future  Other specified hypothyroidism- his TSH is in the normal range so he will stay on the current dose of Synthroid.  Fatty liver disease, nonalcoholic- improvement noted, liver enzymes are normal today, he will continue his lifestyle modifications.  Mixed hyperlipidemia- improvement noted, his triglycerides are normal, his Framingham risk score is only 2% so I do not recommend a statin.   I am having Mr. Abel maintain his levothyroxine.  No orders of the defined types were placed in this encounter.     Follow-up: Return in about 6 months (around 11/04/2015).  Sanda Linger, MD

## 2015-05-06 ENCOUNTER — Encounter: Payer: Self-pay | Admitting: Internal Medicine

## 2015-10-31 ENCOUNTER — Other Ambulatory Visit: Payer: Self-pay | Admitting: Internal Medicine

## 2015-12-06 ENCOUNTER — Telehealth: Payer: Self-pay | Admitting: Emergency Medicine

## 2015-12-06 MED ORDER — LEVOTHYROXINE SODIUM 150 MCG PO TABS
150.0000 ug | ORAL_TABLET | Freq: Every day | ORAL | 2 refills | Status: DC
Start: 2015-12-06 — End: 2016-07-09

## 2015-12-06 NOTE — Telephone Encounter (Signed)
Please call pt back and apologize. He is not due for an appt until around March of next year. I will send in enough refills until then.

## 2015-12-06 NOTE — Telephone Encounter (Signed)
Called pt and left VM informing him. Thanks.

## 2015-12-06 NOTE — Telephone Encounter (Signed)
Pt called and stated when he went to pick up his prescription last month there was a note stating he needs labs. He wants to know which labs and if he needs to make an OV. Please give him a call back. Thanks.

## 2016-07-09 ENCOUNTER — Encounter: Payer: Self-pay | Admitting: Internal Medicine

## 2016-07-09 ENCOUNTER — Other Ambulatory Visit (INDEPENDENT_AMBULATORY_CARE_PROVIDER_SITE_OTHER): Payer: BLUE CROSS/BLUE SHIELD

## 2016-07-09 ENCOUNTER — Ambulatory Visit (INDEPENDENT_AMBULATORY_CARE_PROVIDER_SITE_OTHER): Payer: BLUE CROSS/BLUE SHIELD | Admitting: Internal Medicine

## 2016-07-09 VITALS — BP 148/82 | HR 70 | Temp 97.9°F | Ht 68.0 in | Wt 222.0 lb

## 2016-07-09 DIAGNOSIS — Z0000 Encounter for general adult medical examination without abnormal findings: Secondary | ICD-10-CM

## 2016-07-09 DIAGNOSIS — K760 Fatty (change of) liver, not elsewhere classified: Secondary | ICD-10-CM | POA: Diagnosis not present

## 2016-07-09 DIAGNOSIS — E038 Other specified hypothyroidism: Secondary | ICD-10-CM

## 2016-07-09 LAB — CBC WITH DIFFERENTIAL/PLATELET
Basophils Absolute: 0 10*3/uL (ref 0.0–0.1)
Basophils Relative: 0.5 % (ref 0.0–3.0)
Eosinophils Absolute: 0.1 10*3/uL (ref 0.0–0.7)
Eosinophils Relative: 2.3 % (ref 0.0–5.0)
HCT: 40.6 % (ref 39.0–52.0)
HEMOGLOBIN: 13.3 g/dL (ref 13.0–17.0)
Lymphocytes Relative: 39.7 % (ref 12.0–46.0)
Lymphs Abs: 1.9 10*3/uL (ref 0.7–4.0)
MCHC: 32.8 g/dL (ref 30.0–36.0)
MCV: 75.7 fl — ABNORMAL LOW (ref 78.0–100.0)
Monocytes Absolute: 0.4 10*3/uL (ref 0.1–1.0)
Monocytes Relative: 7.5 % (ref 3.0–12.0)
Neutro Abs: 2.4 10*3/uL (ref 1.4–7.7)
Neutrophils Relative %: 50 % (ref 43.0–77.0)
Platelets: 226 10*3/uL (ref 150.0–400.0)
RBC: 5.37 Mil/uL (ref 4.22–5.81)
RDW: 15.9 % — ABNORMAL HIGH (ref 11.5–15.5)
WBC: 4.9 10*3/uL (ref 4.0–10.5)

## 2016-07-09 LAB — COMPREHENSIVE METABOLIC PANEL
ALBUMIN: 4.4 g/dL (ref 3.5–5.2)
ALK PHOS: 39 U/L (ref 39–117)
ALT: 18 U/L (ref 0–53)
AST: 16 U/L (ref 0–37)
BUN: 8 mg/dL (ref 6–23)
CALCIUM: 9.4 mg/dL (ref 8.4–10.5)
CO2: 26 mEq/L (ref 19–32)
Chloride: 105 mEq/L (ref 96–112)
Creatinine, Ser: 1.13 mg/dL (ref 0.40–1.50)
GFR: 76.89 mL/min (ref >60.00)
Glucose, Bld: 130 mg/dL — ABNORMAL HIGH (ref 70–99)
POTASSIUM: 3.8 meq/L (ref 3.5–5.1)
Sodium: 139 mEq/L (ref 135–145)
TOTAL PROTEIN: 7.1 g/dL (ref 6.0–8.3)
Total Bilirubin: 0.4 mg/dL (ref 0.2–1.2)

## 2016-07-09 LAB — LIPID PANEL
Cholesterol: 189 mg/dL (ref 0–200)
HDL: 29.2 mg/dL — ABNORMAL LOW (ref >39.00)
NonHDL: 159.71
Total CHOL/HDL Ratio: 6
Triglycerides: 261 mg/dL — ABNORMAL HIGH (ref 0.0–149.0)
VLDL: 52.2 mg/dL — ABNORMAL HIGH (ref 0.0–40.0)

## 2016-07-09 LAB — LDL CHOLESTEROL, DIRECT: LDL DIRECT: 125 mg/dL

## 2016-07-09 LAB — TSH: TSH: 2.15 u[IU]/mL (ref 0.35–4.50)

## 2016-07-09 MED ORDER — LEVOTHYROXINE SODIUM 150 MCG PO TABS: 150.0000 ug | ORAL_TABLET | Freq: Every day | ORAL | 1 refills | Status: DC

## 2016-07-09 NOTE — Progress Notes (Signed)
Subjective:  Patient ID: Travis Sawyer, male    DOB: 03/04/1975  Age: 41 y.o. MRN: [id number]  CC: Annual Exam; Hyperlipidemia; and Hypothyroidism   HPI Travis Sawyer presents for a CPX and f/up - he feels well and offers no complaints.  Outpatient Medications Prior to Visit  Medication Sig Dispense Refill  . levothyroxine (SYNTHROID, LEVOTHROID) 150 MCG tablet Take 1 tablet (150 mcg total) by mouth daily. 90 tablet 2   No facility-administered medications prior to visit.     ROS Review of Systems  Constitutional: Negative for appetite change, chills, diaphoresis, fatigue and unexpected weight change.  HENT: Negative.   Eyes: Negative for visual disturbance.  Respiratory: Negative for cough, chest tightness, shortness of breath and wheezing.   Cardiovascular: Negative for chest pain, palpitations and leg swelling.  Gastrointestinal: Negative for abdominal pain, constipation, diarrhea, nausea and vomiting.  Endocrine: Negative.  Negative for cold intolerance and heat intolerance.  Genitourinary: Negative.  Negative for difficulty urinating.  Musculoskeletal: Negative.  Negative for back pain and myalgias.  Skin: Negative.   Allergic/Immunologic: Negative.   Neurological: Negative.  Negative for dizziness.  Hematological: Negative for adenopathy. Does not bruise/bleed easily.  Psychiatric/Behavioral: Negative.     Objective:  BP (!) 148/82 (BP Location: Left Arm, Patient Position: Sitting, Cuff Size: Normal)   Pulse 70   Temp 97.9 F (36.6 C) (Oral)   Ht 5\' 8"  (1.727 m)   Wt 222 lb (100.7 kg)   SpO2 99%   BMI 33.75 kg/m   BP Readings from Last 3 Encounters:  07/09/16 (!) 148/82  05/04/15 116/88  11/02/14 128/88    Wt Readings from Last 3 Encounters:  07/09/16 222 lb (100.7 kg)  05/04/15 219 lb 4 oz (99.5 kg)  11/02/14 218 lb (98.9 kg)    Physical Exam  Constitutional: He is oriented to person, place, and time. No distress.  HENT:  Mouth/Throat: Oropharynx is  clear and moist. No oropharyngeal exudate.  Eyes: Conjunctivae are normal. Right eye exhibits no discharge. Left eye exhibits no discharge. No scleral icterus.  Neck: Normal range of motion. Neck supple. No JVD present. No tracheal deviation present. No thyromegaly present.  Cardiovascular: Normal rate, regular rhythm, normal heart sounds and intact distal pulses.  Exam reveals no gallop and no friction rub.   No murmur heard. Pulmonary/Chest: Effort normal and breath sounds normal. No stridor. No respiratory distress. He has no wheezes. He has no rales. He exhibits no tenderness.  Abdominal: Soft. Bowel sounds are normal. He exhibits no distension and no mass. There is no tenderness. There is no rebound and no guarding.  Musculoskeletal: Normal range of motion. He exhibits no edema, tenderness or deformity.  Lymphadenopathy:    He has no cervical adenopathy.  Neurological: He is oriented to person, place, and time.  Skin: Skin is warm and dry. He is not diaphoretic. No erythema. No pallor.  Vitals reviewed.   Lab Results  Component Value Date   WBC 4.9 07/09/2016   HGB 13.3 07/09/2016   HCT 40.6 07/09/2016   PLT 226.0 07/09/2016   GLUCOSE 130 (H) 07/09/2016   CHOL 189 07/09/2016   TRIG 261.0 (H) 07/09/2016   HDL 29.20 (L) 07/09/2016   LDLDIRECT 125.0 07/09/2016   LDLCALC 148 (H) 05/04/2015   ALT 18 07/09/2016   AST 16 07/09/2016   NA 139 07/09/2016   K 3.8 07/09/2016   CL 105 07/09/2016   CREATININE 1.13 07/09/2016   BUN 8 07/09/2016   CO2 26  07/09/2016   TSH 2.15 07/09/2016   HGBA1C 5.7 05/04/2015    Patient was never admitted.  Assessment & Plan:   Travis Sawyer was seen today for annual exam, hyperlipidemia and hypothyroidism.  Diagnoses and all orders for this visit:  Other specified hypothyroidism- his TSH is in the normal range, I have advised him to remain on the current dose of levothyroxine. -     CBC with Differential/Platelet; Future -     TSH; Future -      levothyroxine (SYNTHROID, LEVOTHROID) 150 MCG tablet; Take 1 tablet (150 mcg total) by mouth daily.  Fatty liver disease, nonalcoholic- his LFTs are normal now, he will continue to work on his lifestyle modifications. -     Comprehensive metabolic panel; Future  Routine general medical examination at a health care facility- his Framingham risk score is only 1% so I do not recommend that he start a statin for CV risk reduction, exam completed, labs ordered and reviewed, vaccines reviewed, patient education material was given. -     Lipid panel; Future   I am having Travis Sawyer maintain his levothyroxine.  Meds ordered this encounter  Medications  . levothyroxine (SYNTHROID, LEVOTHROID) 150 MCG tablet    Sig: Take 1 tablet (150 mcg total) by mouth daily.    Dispense:  90 tablet    Refill:  1     Follow-up: Return in about 6 months (around 01/09/2017).  Sanda Linger, MD

## 2016-07-09 NOTE — Patient Instructions (Signed)
 Health Maintenance, Male A healthy lifestyle and preventive care is important for your health and wellness. Ask your health care provider about what schedule of regular examinations is right for you. What should I know about weight and diet?  Eat a Healthy Diet  Eat plenty of vegetables, fruits, whole grains, low-fat dairy products, and lean protein.  Do not eat a lot of foods high in solid fats, added sugars, or salt. Maintain a Healthy Weight  Regular exercise can help you achieve or maintain a healthy weight. You should:  Do at least 150 minutes of exercise each week. The exercise should increase your heart rate and make you sweat (moderate-intensity exercise).  Do strength-training exercises at least twice a week. Watch Your Levels of Cholesterol and Blood Lipids  Have your blood tested for lipids and cholesterol every 5 years starting at 39 years of age. If you are at high risk for heart disease, you should start having your blood tested when you are 39 years old. You may need to have your cholesterol levels checked more often if:  Your lipid or cholesterol levels are high.  You are older than 39 years of age.  You are at high risk for heart disease. What should I know about cancer screening? Many types of cancers can be detected early and may often be prevented. Lung Cancer  You should be screened every year for lung cancer if:  You are a current smoker who has smoked for at least 30 years.  You are a former smoker who has quit within the past 15 years.  Talk to your health care provider about your screening options, when you should start screening, and how often you should be screened. Colorectal Cancer  Routine colorectal cancer screening usually begins at 39 years of age and should be repeated every 5-10 years until you are 39 years old. You may need to be screened more often if early forms of precancerous polyps or small growths are found. Your health care provider  may recommend screening at an earlier age if you have risk factors for colon cancer.  Your health care provider may recommend using home test kits to check for hidden blood in the stool.  A small camera at the end of a tube can be used to examine your colon (sigmoidoscopy or colonoscopy). This checks for the earliest forms of colorectal cancer. Prostate and Testicular Cancer  Depending on your age and overall health, your health care provider may do certain tests to screen for prostate and testicular cancer.  Talk to your health care provider about any symptoms or concerns you have about testicular or prostate cancer. Skin Cancer  Check your skin from head to toe regularly.  Tell your health care provider about any new moles or changes in moles, especially if:  There is a change in a mole's size, shape, or color.  You have a mole that is larger than a pencil eraser.  Always use sunscreen. Apply sunscreen liberally and repeat throughout the day.  Protect yourself by wearing long sleeves, pants, a wide-brimmed hat, and sunglasses when outside. What should I know about heart disease, diabetes, and high blood pressure?  If you are 18-39 years of age, have your blood pressure checked every 3-5 years. If you are 40 years of age or older, have your blood pressure checked every year. You should have your blood pressure measured twice-once when you are at a hospital or clinic, and once when you are not at   a hospital or clinic. Record the average of the two measurements. To check your blood pressure when you are not at a hospital or clinic, you can use:  An automated blood pressure machine at a pharmacy.  A home blood pressure monitor.  Talk to your health care provider about your target blood pressure.  If you are between 45-79 years old, ask your health care provider if you should take aspirin to prevent heart disease.  Have regular diabetes screenings by checking your fasting blood sugar  level.  If you are at a normal weight and have a low risk for diabetes, have this test once every three years after the age of 45.  If you are overweight and have a high risk for diabetes, consider being tested at a younger age or more often.  A one-time screening for abdominal aortic aneurysm (AAA) by ultrasound is recommended for men aged 65-75 years who are current or former smokers. What should I know about preventing infection? Hepatitis B  If you have a higher risk for hepatitis B, you should be screened for this virus. Talk with your health care provider to find out if you are at risk for hepatitis B infection. Hepatitis C  Blood testing is recommended for:  Everyone born from 1945 through 1965.  Anyone with known risk factors for hepatitis C. Sexually Transmitted Diseases (STDs)  You should be screened each year for STDs including gonorrhea and chlamydia if:  You are sexually active and are younger than 39 years of age.  You are older than 39 years of age and your health care provider tells you that you are at risk for this type of infection.  Your sexual activity has changed since you were last screened and you are at an increased risk for chlamydia or gonorrhea. Ask your health care provider if you are at risk.  Talk with your health care provider about whether you are at high risk of being infected with HIV. Your health care provider may recommend a prescription medicine to help prevent HIV infection. What else can I do?  Schedule regular health, dental, and eye exams.  Stay current with your vaccines (immunizations).  Do not use any tobacco products, such as cigarettes, chewing tobacco, and e-cigarettes. If you need help quitting, ask your health care provider.  Limit alcohol intake to no more than 2 drinks per day. One drink equals 12 ounces of beer, 5 ounces of wine, or 1 ounces of hard liquor.  Do not use street drugs.  Do not share needles.  Ask your health  care provider for help if you need support or information about quitting drugs.  Tell your health care provider if you often feel depressed.  Tell your health care provider if you have ever been abused or do not feel safe at home. This information is not intended to replace advice given to you by your health care provider. Make sure you discuss any questions you have with your health care provider. Document Released: 07/27/2007 Document Revised: 09/27/2015 Document Reviewed: 11/01/2014 Elsevier Interactive Patient Education  2017 Elsevier Inc.  

## 2017-03-14 ENCOUNTER — Other Ambulatory Visit: Payer: Self-pay | Admitting: Internal Medicine

## 2017-03-14 DIAGNOSIS — E038 Other specified hypothyroidism: Secondary | ICD-10-CM

## 2017-04-07 ENCOUNTER — Other Ambulatory Visit: Payer: Self-pay | Admitting: Internal Medicine

## 2017-04-07 DIAGNOSIS — E038 Other specified hypothyroidism: Secondary | ICD-10-CM

## 2017-04-07 NOTE — Telephone Encounter (Signed)
Route to CMA °

## 2017-04-07 NOTE — Telephone Encounter (Signed)
Copied from CRM [phone]. Topic: Inquiry >> Apr 07, 2017  9:00 AM Yvonna Alanisobinson, Andra M wrote: Reason for CRM: Patient called requesting a 90 Day Supply refill of Levothyroxine (SYNTHROID, LEVOTHROID) 150 MCG tablet to [facility]. [facility] [place] - [address] [phone] (Phone) [phone] (Fax)

## 2017-04-07 NOTE — Telephone Encounter (Signed)
Last office visit 07/09/16; last TSH nl. 07/09/16; was advised to f/u in office in 6 mos. PCP: Dr. Sanda Lingerhomas Jones [facility], [place], [place].

## 2017-04-08 MED ORDER — LEVOTHYROXINE SODIUM 150 MCG PO TABS: 150.0000 ug | ORAL_TABLET | Freq: Every day | ORAL | 3 refills | Status: DC

## 2017-04-08 NOTE — Telephone Encounter (Signed)
Spoke with patient. He did not fill the 90day supply, his insurance will not allow it. He only had 30days and is about out at this time. He is requesting a 30day supply to [facility]   He has also set up his CPE for 6/3. His last one was 5/29. Thank you.

## 2017-04-08 NOTE — Telephone Encounter (Signed)
This has been done as requested.

## 2017-04-08 NOTE — Telephone Encounter (Signed)
Refill not appropriated. Last sent in levothyroxine on 03/14/2017 for a 90 day with not refills.  Pt will need schedule an appt for any additional refills. Will you inform pt of same.

## 2017-07-14 ENCOUNTER — Other Ambulatory Visit (INDEPENDENT_AMBULATORY_CARE_PROVIDER_SITE_OTHER): Payer: Managed Care, Other (non HMO)

## 2017-07-14 ENCOUNTER — Encounter: Payer: Self-pay | Admitting: Internal Medicine

## 2017-07-14 ENCOUNTER — Other Ambulatory Visit: Payer: Self-pay | Admitting: Internal Medicine

## 2017-07-14 ENCOUNTER — Ambulatory Visit (INDEPENDENT_AMBULATORY_CARE_PROVIDER_SITE_OTHER): Payer: Managed Care, Other (non HMO) | Admitting: Internal Medicine

## 2017-07-14 VITALS — BP 118/80 | HR 64 | Temp 97.9°F | Wt 215.0 lb

## 2017-07-14 DIAGNOSIS — Z0000 Encounter for general adult medical examination without abnormal findings: Secondary | ICD-10-CM | POA: Diagnosis not present

## 2017-07-14 DIAGNOSIS — R739 Hyperglycemia, unspecified: Secondary | ICD-10-CM

## 2017-07-14 DIAGNOSIS — E039 Hypothyroidism, unspecified: Secondary | ICD-10-CM

## 2017-07-14 DIAGNOSIS — K760 Fatty (change of) liver, not elsewhere classified: Secondary | ICD-10-CM | POA: Diagnosis not present

## 2017-07-14 DIAGNOSIS — E038 Other specified hypothyroidism: Secondary | ICD-10-CM

## 2017-07-14 LAB — COMPREHENSIVE METABOLIC PANEL
ALT: 17 U/L (ref 0–53)
AST: 14 U/L (ref 0–37)
Albumin: 4.4 g/dL (ref 3.5–5.2)
Alkaline Phosphatase: 43 U/L (ref 39–117)
BUN: 11 mg/dL (ref 6–23)
CHLORIDE: 105 meq/L (ref 96–112)
CO2: 27 meq/L (ref 19–32)
Calcium: 9.4 mg/dL (ref 8.4–10.5)
Creatinine, Ser: 1.1 mg/dL (ref 0.40–1.50)
GFR: 78.9 mL/min (ref >60.00)
Glucose, Bld: 95 mg/dL (ref 70–99)
Potassium: 4.3 mEq/L (ref 3.5–5.1)
Sodium: 139 mEq/L (ref 135–145)
Total Bilirubin: 0.5 mg/dL (ref 0.2–1.2)
Total Protein: 7.1 g/dL (ref 6.0–8.3)

## 2017-07-14 LAB — LIPID PANEL
Cholesterol: 195 mg/dL (ref 0–200)
HDL: 29.2 mg/dL — AB (ref >39.00)
LDL Cholesterol: 130 mg/dL — ABNORMAL HIGH (ref 0–99)
NONHDL: 165.46
Total CHOL/HDL Ratio: 7
Triglycerides: 179 mg/dL — ABNORMAL HIGH (ref 0.0–149.0)
VLDL: 35.8 mg/dL (ref 0.0–40.0)

## 2017-07-14 LAB — HEMOGLOBIN A1C: HEMOGLOBIN A1C: 6.3 % (ref 4.6–6.5)

## 2017-07-14 LAB — TSH: TSH: 2.63 u[IU]/mL (ref 0.35–4.50)

## 2017-07-14 MED ORDER — LEVOTHYROXINE SODIUM 150 MCG PO TABS: 150.0000 ug | ORAL_TABLET | Freq: Every day | ORAL | 1 refills | Status: DC

## 2017-07-14 NOTE — Patient Instructions (Signed)

## 2017-07-14 NOTE — Progress Notes (Signed)
Subjective:  Patient ID: Travis Sawyer, male    DOB: [birth year]  Age: 42 y.o. MRN: [id number]  CC: Hypothyroidism and Annual Exam   HPI Travis Sawyer presents for a CPX.  He feels well and offers no complaints.  He feels like his thyroid dose is adequate.  He denies any recent episodes of weight changes, constipation, edema, or fatigue.  Outpatient Medications Prior to Visit  Medication Sig Dispense Refill  . levothyroxine (SYNTHROID, LEVOTHROID) 150 MCG tablet Take 1 tablet (150 mcg total) by mouth daily. 30 tablet 3   No facility-administered medications prior to visit.     ROS Review of Systems  Constitutional: Negative for diaphoresis and fatigue.  HENT: Negative.   Eyes: Negative for visual disturbance.  Respiratory: Negative for cough, chest tightness, shortness of breath and wheezing.   Cardiovascular: Negative for chest pain, palpitations and leg swelling.  Gastrointestinal: Negative for abdominal pain, constipation, diarrhea, nausea and vomiting.  Endocrine: Negative.  Negative for cold intolerance and heat intolerance.  Genitourinary: Negative.  Negative for difficulty urinating, penile swelling, scrotal swelling, testicular pain and urgency.  Musculoskeletal: Negative.  Negative for back pain, myalgias and neck pain.  Skin: Negative.  Negative for rash.  Allergic/Immunologic: Negative.   Neurological: Negative.  Negative for dizziness, weakness and light-headedness.  Hematological: Negative for adenopathy. Does not bruise/bleed easily.  Psychiatric/Behavioral: Negative.   All other systems reviewed and are negative.   Objective:  BP 118/80   Pulse 64   Temp 97.9 F (36.6 C)   Wt 215 lb (97.5 kg)   SpO2 98%   BMI 32.69 kg/m   BP Readings from Last 3 Encounters:  07/14/17 118/80  07/09/16 (!) 148/82  05/04/15 116/88    Wt Readings from Last 3 Encounters:  07/14/17 215 lb (97.5 kg)  07/09/16 222 lb (100.7 kg)  05/04/15 219 lb 4 oz (99.5 kg)     Physical Exam  Constitutional: He is oriented to person, place, and time. No distress.  HENT:  Mouth/Throat: Oropharynx is clear and moist. No oropharyngeal exudate.  Eyes: Conjunctivae are normal. No scleral icterus.  Neck: Normal range of motion. Neck supple. No JVD present. No thyromegaly present.  Cardiovascular: Normal rate, regular rhythm and normal heart sounds.  No murmur heard. Pulmonary/Chest: Effort normal and breath sounds normal. He has no wheezes. He has no rales.  Abdominal: Soft. Bowel sounds are normal. He exhibits no mass. There is no hepatosplenomegaly. There is no tenderness.  Musculoskeletal: Normal range of motion. He exhibits no edema, tenderness or deformity.  Lymphadenopathy:    He has no cervical adenopathy.  Neurological: He is alert and oriented to person, place, and time.  Skin: Skin is warm and dry. No rash noted. He is not diaphoretic.  Vitals reviewed.   Lab Results  Component Value Date   WBC 4.9 07/09/2016   HGB 13.3 07/09/2016   HCT 40.6 07/09/2016   PLT 226.0 07/09/2016   GLUCOSE 95 07/14/2017   CHOL 195 07/14/2017   TRIG 179.0 (H) 07/14/2017   HDL 29.20 (L) 07/14/2017   LDLDIRECT 125.0 07/09/2016   LDLCALC 130 (H) 07/14/2017   ALT 17 07/14/2017   AST 14 07/14/2017   NA 139 07/14/2017   K 4.3 07/14/2017   CL 105 07/14/2017   CREATININE 1.10 07/14/2017   BUN 11 07/14/2017   CO2 27 07/14/2017   TSH 2.63 07/14/2017   HGBA1C 6.3 07/14/2017    Patient was never admitted.  Assessment & Plan:   Travis Sawyer  was seen today for hypothyroidism and annual exam.  Diagnoses and all orders for this visit:  Fatty liver disease, nonalcoholic- His LFTs are normal now. -     Comprehensive metabolic panel; Future  Acquired hypothyroidism- His TSH is in the normal range.  He will remain on the current dose of levothyroxine. -     TSH; Future -     Discontinue: levothyroxine (SYNTHROID, LEVOTHROID) 150 MCG tablet; Take 1 tablet (150 mcg total) by  mouth daily. -     Discontinue: levothyroxine (SYNTHROID, LEVOTHROID) 150 MCG tablet; Take 1 tablet (150 mcg total) by mouth daily. -     levothyroxine (SYNTHROID, LEVOTHROID) 150 MCG tablet; Take 1 tablet (150 mcg total) by mouth daily.  Hyperglycemia- His A1c is up to 6.3%.  He is prediabetic.  Medical therapy is not indicated.  He was encouraged to improve his lifestyle modifications. -     Comprehensive metabolic panel; Future -     Hemoglobin A1c; Future  Routine general medical examination at a health care facility- Exam completed, labs reviewed, vaccines reviewed, patient education material was given. -     Lipid panel; Future  Other specified hypothyroidism   I am having Travis Sawyer maintain his levothyroxine.  Meds ordered this encounter  Medications  . DISCONTD: levothyroxine (SYNTHROID, LEVOTHROID) 150 MCG tablet    Sig: Take 1 tablet (150 mcg total) by mouth daily.    Dispense:  90 tablet    Refill:  1  . DISCONTD: levothyroxine (SYNTHROID, LEVOTHROID) 150 MCG tablet    Sig: Take 1 tablet (150 mcg total) by mouth daily.    Dispense:  90 tablet    Refill:  1  . levothyroxine (SYNTHROID, LEVOTHROID) 150 MCG tablet    Sig: Take 1 tablet (150 mcg total) by mouth daily.    Dispense:  90 tablet    Refill:  1     Follow-up: Return in about 6 months (around 01/13/2018).  Sanda Lingerhomas Lloyde Ludlam, MD

## 2017-07-15 MED ORDER — LEVOTHYROXINE SODIUM 150 MCG PO TABS: 150.0000 ug | ORAL_TABLET | Freq: Every day | ORAL | 1 refills | Status: DC

## 2017-10-03 ENCOUNTER — Ambulatory Visit: Payer: Managed Care, Other (non HMO) | Admitting: Family Medicine

## 2017-10-03 ENCOUNTER — Encounter: Payer: Self-pay | Admitting: Family Medicine

## 2017-10-03 VITALS — BP 126/76 | HR 76 | Ht 68.0 in | Wt 222.0 lb

## 2017-10-03 DIAGNOSIS — M25511 Pain in right shoulder: Secondary | ICD-10-CM | POA: Diagnosis not present

## 2017-10-03 MED ORDER — DICLOFENAC SODIUM 2 % TD SOLN: 1.0000 "application " | Freq: Two times a day (BID) | TRANSDERMAL | 3 refills | Status: DC

## 2017-10-03 MED ORDER — MELOXICAM 15 MG PO TABS: 15.0000 mg | ORAL_TABLET | Freq: Every day | ORAL | 0 refills | Status: DC

## 2017-10-03 NOTE — Progress Notes (Signed)
Travis Sawyer - 42 y.o. male MRN [id number]  Date of birth: [birth year]  SUBJECTIVE:  Including CC & ROS.  Chief Complaint  Patient presents with  . Right shoulder pain    Travis Sawyer is a 42 y.o. male that is presenting with right shoulder pain. Ongoing for three months. Located at the anterior aspect of his shoulder. Pain is mild to severe, worse when lifting his arm. Radiates down his forearm. Denies tingling or numbness. Denies an inciting event or trauma. He experienced similar symptoms the beginning of year subsided after two weeks. He has not been taking anything for the pain.  He works at a computer most all day.  He denies any inciting event.    Review of Systems  Constitutional: Negative for fever.  HENT: Negative for congestion.   Respiratory: Negative for cough.   Cardiovascular: Negative for chest pain.  Gastrointestinal: Negative for abdominal pain.  Musculoskeletal: Negative for gait problem.  Skin: Negative for color change.  Hematological: Negative for adenopathy.  Psychiatric/Behavioral: Negative for agitation.    HISTORY: Past Medical, Surgical, Social, and Family History Reviewed & Updated per EMR.   Pertinent Historical Findings include:  Past Medical History:  Diagnosis Date  . Hyperlipidemia   . Hypertension     Past Surgical History:  Procedure Laterality Date  . VASECTOMY      No Known Allergies  Family History  Problem Relation Age of Onset  . Diabetes Mother   . Diabetes Father   . Hypertension Father   . Cancer Neg Hx   . Hyperlipidemia Neg Hx   . Heart disease Neg Hx   . Mental illness Neg Hx   . Early death Neg Hx   . Kidney disease Neg Hx   . Stroke Neg Hx      Social History   Socioeconomic History  . Marital status: Married    Spouse name: Not on file  . Number of children: Not on file  . Years of education: Not on file  . Highest education level: Not on file  Occupational History  . Not on file  Social Needs  .  Financial resource strain: Not on file  . Food insecurity:    Worry: Not on file    Inability: Not on file  . Transportation needs:    Medical: Not on file    Non-medical: Not on file  Tobacco Use  . Smoking status: Never Smoker  . Smokeless tobacco: Never Used  Substance and Sexual Activity  . Alcohol use: Yes    Alcohol/week: 3.0 standard drinks    Types: 3 Glasses of wine per week  . Drug use: No  . Sexual activity: Yes  Lifestyle  . Physical activity:    Days per week: Not on file    Minutes per session: Not on file  . Stress: Not on file  Relationships  . Social connections:    Talks on phone: Not on file    Gets together: Not on file    Attends religious service: Not on file    Active member of club or organization: Not on file    Attends meetings of clubs or organizations: Not on file    Relationship status: Not on file  . Intimate partner violence:    Fear of current or ex partner: Not on file    Emotionally abused: Not on file    Physically abused: Not on file    Forced sexual activity: Not on file  Other Topics  Concern  . Not on file  Social History Narrative  . Not on file     PHYSICAL EXAM:  VS: BP 126/76 (BP Location: Left Arm, Patient Position: Sitting, Cuff Size: Normal)   Pulse 76   Ht 5\' 8"  (1.727 m)   Wt 222 lb (100.7 kg)   SpO2 96%   BMI 33.75 kg/m  Physical Exam Gen: NAD, alert, cooperative with exam, well-appearing ENT: normal lips, normal nasal mucosa,  Eye: normal EOM, normal conjunctiva and lids CV:  no edema, +2 pedal pulses   Resp: no accessory muscle use, non-labored,  Skin: no rashes, no areas of induration  Neuro: normal tone, normal sensation to touch Psych:  normal insight, alert and oriented MSK:  Right Shoulder: Inspection reveals no abnormalities, atrophy or asymmetry. Palpation is normal with no tenderness over AC joint . ROM is full in all planes. Rotator cuff strength normal throughout. negative Hawkin's tests, empty  can sign. Speeds normal. negative Obrien's, negative clunk and good stability. No apprehension sign Neurovascularly intact   Limited ultrasound: right shoulder:  Normal appearing BT  Normal appearing subscap in static and dynamic testing  Supraspinatus looks normal but has suggestion of an enlarged subacromial bursa  Normal appearing infraspinatus  AC joint with degenerative changes but no significant effusion   Summary: findings suggestive of subacromial bursitis   Ultrasound and interpretation by Clare GandyJeremy Schmitz, MD            ASSESSMENT & PLAN:   Acute pain of right shoulder Findings on US suggestive of bursitis. Normal appearing rotator cuff.  - mobic  - pennsaid  - counseled on HEP  - counseledon on supportive  - thera band  - if no improvement consider injection and imaging.

## 2017-10-03 NOTE — Assessment & Plan Note (Signed)
Findings on US suggestive of bursitis. Normal appearing rotator cuff.  - mobic  - pennsaid  - counseled on HEP  - counseledon on supportive  - thera band  - if no improvement consider injection and imaging.

## 2017-10-03 NOTE — Patient Instructions (Addendum)
Nice to meet you  Please try the mobic for 10 days straight and then as needed Please try ice on the shoulder  Please try the exercises  Please try working on your posture at work  Please follow up with me in 3-4 weeks and we can try an injection if you still have pain

## 2017-11-02 ENCOUNTER — Emergency Department (HOSPITAL_COMMUNITY): Payer: Managed Care, Other (non HMO)

## 2017-11-02 ENCOUNTER — Encounter (HOSPITAL_COMMUNITY): Payer: Self-pay | Admitting: Nurse Practitioner

## 2017-11-02 ENCOUNTER — Observation Stay (HOSPITAL_COMMUNITY)
Admission: EM | Admit: 2017-11-02 | Discharge: 2017-11-04 | Disposition: A | Discharge status: Home / Self Care | Payer: Managed Care, Other (non HMO) | Attending: General Surgery | Admitting: General Surgery

## 2017-11-02 DIAGNOSIS — Z6833 Body mass index (BMI) 33.0-33.9, adult: Secondary | ICD-10-CM | POA: Insufficient documentation

## 2017-11-02 DIAGNOSIS — E782 Mixed hyperlipidemia: Secondary | ICD-10-CM | POA: Diagnosis not present

## 2017-11-02 DIAGNOSIS — E039 Hypothyroidism, unspecified: Secondary | ICD-10-CM | POA: Insufficient documentation

## 2017-11-02 DIAGNOSIS — Z79890 Hormone replacement therapy (postmenopausal): Secondary | ICD-10-CM | POA: Insufficient documentation

## 2017-11-02 DIAGNOSIS — K3580 Unspecified acute appendicitis: Secondary | ICD-10-CM | POA: Diagnosis not present

## 2017-11-02 DIAGNOSIS — I10 Essential (primary) hypertension: Secondary | ICD-10-CM | POA: Diagnosis not present

## 2017-11-02 DIAGNOSIS — Z79899 Other long term (current) drug therapy: Secondary | ICD-10-CM | POA: Insufficient documentation

## 2017-11-02 HISTORY — DX: Disorder of thyroid, unspecified: E07.9

## 2017-11-02 HISTORY — DX: Hypothyroidism, unspecified: E03.9

## 2017-11-02 LAB — COMPREHENSIVE METABOLIC PANEL
ALK PHOS: 49 U/L (ref 38–126)
ALT: 23 U/L (ref 0–44)
ANION GAP: 11 (ref 5–15)
AST: 21 U/L (ref 15–41)
Albumin: 4.6 g/dL (ref 3.5–5.0)
BUN: 10 mg/dL (ref 6–20)
CO2: 25 mmol/L (ref 22–32)
CREATININE: 1.11 mg/dL (ref 0.61–1.24)
Calcium: 9.1 mg/dL (ref 8.9–10.3)
Chloride: 102 mmol/L (ref 98–111)
GFR calc Af Amer: >60 mL/min (ref >60)
GLUCOSE: 171 mg/dL — AB (ref 70–99)
Potassium: 3.6 mmol/L (ref 3.5–5.1)
SODIUM: 138 mmol/L (ref 135–145)
TOTAL PROTEIN: 8.1 g/dL (ref 6.5–8.1)
Total Bilirubin: 1.3 mg/dL — ABNORMAL HIGH (ref 0.3–1.2)

## 2017-11-02 LAB — CBC
HEMATOCRIT: 44.4 % (ref 39.0–52.0)
Hemoglobin: 14.3 g/dL (ref 13.0–17.0)
MCH: 25.1 pg — ABNORMAL LOW (ref 26.0–34.0)
MCHC: 32.2 g/dL (ref 30.0–36.0)
MCV: 78 fL (ref 78.0–100.0)
PLATELETS: 236 10*3/uL (ref 150–400)
RBC: 5.69 MIL/uL (ref 4.22–5.81)
RDW: 14.5 % (ref 11.5–15.5)
WBC: 11.4 10*3/uL — ABNORMAL HIGH (ref 4.0–10.5)

## 2017-11-02 LAB — URINALYSIS, ROUTINE W REFLEX MICROSCOPIC
BILIRUBIN URINE: NEGATIVE
GLUCOSE, UA: NEGATIVE mg/dL
HGB URINE DIPSTICK: NEGATIVE
Ketones, ur: NEGATIVE mg/dL
Leukocytes, UA: NEGATIVE
Nitrite: NEGATIVE
PH: 6 (ref 5.0–8.0)
Protein, ur: NEGATIVE mg/dL
Specific Gravity, Urine: 1.011 (ref 1.005–1.030)

## 2017-11-02 LAB — I-STAT CHEM 8, ED
BUN: 7 mg/dL (ref 6–20)
CALCIUM ION: 1.1 mmol/L — AB (ref 1.15–1.40)
CREATININE: 1.1 mg/dL (ref 0.61–1.24)
Chloride: 100 mmol/L (ref 98–111)
GLUCOSE: 168 mg/dL — AB (ref 70–99)
HCT: 45 % (ref 39.0–52.0)
Hemoglobin: 15.3 g/dL (ref 13.0–17.0)
Potassium: 3.5 mmol/L (ref 3.5–5.1)
Sodium: 137 mmol/L (ref 135–145)
TCO2: 25 mmol/L (ref 22–32)

## 2017-11-02 LAB — LIPASE, BLOOD: LIPASE: 32 U/L (ref 11–51)

## 2017-11-02 MED ORDER — IOPAMIDOL (ISOVUE-300) INJECTION 61%
100.0000 mL | Freq: Once | INTRAVENOUS | Status: AC | PRN
  Administered 2017-11-02: 100 mL via INTRAVENOUS

## 2017-11-02 MED ORDER — IOPAMIDOL (ISOVUE-300) INJECTION 61%
INTRAVENOUS | Status: AC
  Filled 2017-11-02: qty 100

## 2017-11-02 MED ORDER — SODIUM CHLORIDE 0.9 % IV BOLUS
1000.0000 mL | Freq: Once | INTRAVENOUS | Status: AC
  Administered 2017-11-02: 1000 mL via INTRAVENOUS

## 2017-11-02 MED ORDER — ONDANSETRON HCL 4 MG/2ML IJ SOLN
4.0000 mg | Freq: Once | INTRAMUSCULAR | Status: AC
  Administered 2017-11-02: 4 mg via INTRAVENOUS
  Filled 2017-11-02: qty 2

## 2017-11-02 MED ORDER — SODIUM CHLORIDE 0.9 % IJ SOLN
INTRAMUSCULAR | Status: AC
  Filled 2017-11-02: qty 50

## 2017-11-02 MED ORDER — MORPHINE SULFATE (PF) 4 MG/ML IV SOLN
4.0000 mg | Freq: Once | INTRAVENOUS | Status: AC
  Administered 2017-11-02: 4 mg via INTRAVENOUS
  Filled 2017-11-02: qty 1

## 2017-11-02 NOTE — H&P (Signed)
Travis Sawyer is an 42 y.o. male.    Chief Complaint: Abdominal pain  HPI: Patient about 24 hours ago developed the gradual onset of somewhat diffuse constant lower abdominal pain.  Hours later the pain migrated to the right lower quadrant and has been persistent and somewhat more severe.  Worse with movement.  He has had some nausea without vomiting.  No fever chills.  No change in normal bowel movements.  No urinary symptoms.  No history of similar symptoms or chronic GI complaints.  Past Medical History:  Diagnosis Date  . Hyperlipidemia   . Hypertension   . Thyroid disease     Past Surgical History:  Procedure Laterality Date  . VASECTOMY      Family History  Problem Relation Age of Onset  . Diabetes Mother   . Diabetes Father   . Hypertension Father   . Cancer Neg Hx   . Hyperlipidemia Neg Hx   . Heart disease Neg Hx   . Mental illness Neg Hx   . Early death Neg Hx   . Kidney disease Neg Hx   . Stroke Neg Hx    Social History:  reports that he has never smoked. He has never used smokeless tobacco. He reports that he drinks about 3.0 standard drinks of alcohol per week. He reports that he does not use drugs.  Allergies: No Known Allergies  Current Facility-Administered Medications  Medication Dose Route Frequency Provider Last Rate Last Dose  . iopamidol (ISOVUE-300) 61 % injection           . sodium chloride 0.9 % injection            Current Outpatient Medications  Medication Sig Dispense Refill  . acetaminophen (TYLENOL) 500 MG tablet Take 1,000 mg by mouth every 6 (six) hours as needed for mild pain, moderate pain or headache.    . levothyroxine (SYNTHROID, LEVOTHROID) 150 MCG tablet Take 1 tablet (150 mcg total) by mouth daily. 90 tablet 1  . Diclofenac Sodium (PENNSAID) 2 % SOLN Place 1 application onto the skin 2 (two) times daily. [phone] (Mobile) (Patient not taking: Reported on 11/02/2017) 1 Bottle 3  . meloxicam (MOBIC) 15 MG tablet Take 1 tablet (15 mg  total) by mouth daily. (Patient not taking: Reported on 11/02/2017) 30 tablet 0     Results for orders placed or performed during the hospital encounter of 11/02/17 (from the past 48 hour(s))  Lipase, blood     Status: None   Collection Time: 11/02/17  5:53 PM  Result Value Ref Range   Lipase 32 11 - 51 U/L    Comment: Performed at [facility], [place] [address] [place], [place] [address]  Comprehensive metabolic panel     Status: Abnormal   Collection Time: 11/02/17  5:53 PM  Result Value Ref Range   Sodium 138 135 - 145 mmol/L   Potassium 3.6 3.5 - 5.1 mmol/L   Chloride 102 98 - 111 mmol/L   CO2 25 22 - 32 mmol/L   Glucose, Bld 171 (H) 70 - 99 mg/dL   BUN 10 6 - 20 mg/dL   Creatinine, Ser 1.11 0.61 - 1.24 mg/dL   Calcium 9.1 8.9 - 10.3 mg/dL   Total Protein 8.1 6.5 - 8.1 g/dL   Albumin 4.6 3.5 - 5.0 g/dL   AST 21 15 - 41 U/L   ALT 23 0 - 44 U/L   Alkaline Phosphatase 49 38 - 126 U/L   Total Bilirubin  1.3 (H) 0.3 - 1.2 mg/dL   GFR calc non Af Amer >60 >60 mL/min   GFR calc Af Amer >60 >60 mL/min    Comment: (NOTE) The eGFR has been calculated using the CKD EPI equation. This calculation has not been validated in all clinical situations. eGFR's persistently <60 mL/min signify possible Chronic Kidney Disease.    Anion gap 11 5 - 15    Comment: Performed at [facility], [place] [address] [place], [place] [address]  CBC     Status: Abnormal   Collection Time: 11/02/17  5:53 PM  Result Value Ref Range   WBC 11.4 (H) 4.0 - 10.5 K/uL   RBC 5.69 4.22 - 5.81 MIL/uL   Hemoglobin 14.3 13.0 - 17.0 g/dL   HCT 44.4 39.0 - 52.0 %   MCV 78.0 78.0 - 100.0 fL   MCH 25.1 (L) 26.0 - 34.0 pg   MCHC 32.2 30.0 - 36.0 g/dL   RDW 14.5 11.5 - 15.5 %   Platelets 236 150 - 400 K/uL    Comment: Performed at [facility], [place] [address], [place], [place] [address]  Urinalysis, Routine w reflex microscopic     Status: None   Collection Time:  11/02/17  9:46 PM  Result Value Ref Range   Color, Urine YELLOW YELLOW   APPearance CLEAR CLEAR   Specific Gravity, Urine 1.011 1.005 - 1.030   pH 6.0 5.0 - 8.0   Glucose, UA NEGATIVE NEGATIVE mg/dL   Hgb urine dipstick NEGATIVE NEGATIVE   Bilirubin Urine NEGATIVE NEGATIVE   Ketones, ur NEGATIVE NEGATIVE mg/dL   Protein, ur NEGATIVE NEGATIVE mg/dL   Nitrite NEGATIVE NEGATIVE   Leukocytes, UA NEGATIVE NEGATIVE    Comment: Performed at [facility] [address] [place], [place] [address]  I-stat chem 8, ed     Status: Abnormal   Collection Time: 11/02/17  9:53 PM  Result Value Ref Range   Sodium 137 135 - 145 mmol/L   Potassium 3.5 3.5 - 5.1 mmol/L   Chloride 100 98 - 111 mmol/L   BUN 7 6 - 20 mg/dL   Creatinine, Ser 1.10 0.61 - 1.24 mg/dL   Glucose, Bld 168 (H) 70 - 99 mg/dL   Calcium, Ion 1.10 (L) 1.15 - 1.40 mmol/L   TCO2 25 22 - 32 mmol/L   Hemoglobin 15.3 13.0 - 17.0 g/dL   HCT 45.0 39.0 - 52.0 %   Ct Abdomen Pelvis W Contrast  Result Date: 11/02/2017 CLINICAL DATA:  42 y/o M; right lower quadrant abdominal pain. Progressive nausea and chills. EXAM: CT ABDOMEN AND PELVIS WITH CONTRAST TECHNIQUE: Multidetector CT imaging of the abdomen and pelvis was performed using the standard protocol following bolus administration of intravenous contrast. CONTRAST:  167m ISOVUE-300 IOPAMIDOL (ISOVUE-300) INJECTION 61% COMPARISON:  None. FINDINGS: Lower chest: No acute abnormality. Hepatobiliary: No focal liver abnormality is seen. No gallstones, gallbladder wall thickening, or biliary dilatation. Pancreas: Unremarkable. No pancreatic ductal dilatation or surrounding inflammatory changes. Spleen: Normal in size without focal abnormality. Adrenals/Urinary Tract: Adrenal glands are unremarkable. 12 mm cyst within the right kidney lower pole subcentimeter cyst in the left kidney lower pole. Left kidney lower pole punctate nonobstructing stone. No ureter stone or hydronephrosis.  Normal bladder. Stomach/Bowel: Normal appearance of stomach, small bowel, and colon excepting below. Acute appendicitis. Appendix: Location: Retrocecal Diameter: 12 mm Appendicolith: Negative. Mucosal hyper-enhancement: Positive. Extraluminal gas: Negative. Periappendiceal collection: Periappendiceal edema without rim enhancing collection. Vascular/Lymphatic: No significant  vascular findings are present. No enlarged abdominal or pelvic lymph nodes. Reproductive: Prostate is unremarkable. Other: Small left inguinal hernia containing fat. Musculoskeletal: No fracture is seen. IMPRESSION: 1. Acute appendicitis. No findings of perforation or abscess at this time. 2. Left kidney lower pole punctate nonobstructing stone. 3. Small left inguinal hernia containing fat. These results were called by telephone at the time of interpretation on 11/02/2017 at 11:10 pm to Dr. Shirlyn Goltz , who verbally acknowledged these results. Electronically Signed   By: Kristine Garbe M.D.   On: 11/02/2017 23:12    Review of Systems  Constitutional: Negative for chills and fever.  Respiratory: Negative.   Cardiovascular: Negative.   Gastrointestinal: Positive for abdominal pain and nausea. Negative for blood in stool, constipation, diarrhea and vomiting.  Genitourinary: Negative.     Blood pressure (!) 148/98, pulse 92, temperature 98.4 F (36.9 C), temperature source Oral, resp. rate 19, SpO2 95 %. Physical Exam  General: Alert, mildly obese Caucasian male, in no distress Skin: Warm and dry without rash or infection. HEENT: No palpable masses or thyromegaly. Sclera nonicteric.  Lymph nodes: No cervical, supraclavicular,nodes palpable. Lungs: Breath sounds clear and equal without increased work of breathing Cardiovascular: Regular rate and rhythm without murmur. No JVD or edema. Peripheral pulses intact. Abdomen: Nondistended.  There is localized right lower quadrant tenderness with referred and rebound tenderness.   No masses palpable. No organomegaly. No palpable hernias. Extremities: No edema or joint swelling or deformity. No chronic venous stasis changes. Neurologic: Alert and fully oriented.  Affect normal.  No gross motor deficits.   Assessment/Plan History and physical findings typical for acute appendicitis which is confirmed on CT scan.  No evidence of perforation or abscess.  Starting on broad-spectrum IV antibiotics.  He will be admitted for urgent laparoscopic appendectomy.  I discussed alternatives of antibiotic treatment as well as the nature of surgery and expected recovery, risks of anesthetic complications, bleeding, infection or open procedure.  All questions answered.  He understands and agrees to proceed.  Edward Jolly, MD 11/02/2017, 11:52 PM

## 2017-11-02 NOTE — ED Triage Notes (Signed)
Pt presents with c/o RLQ abdominal pain, states he was sent from urgent care for possible appendicitis. Reports that symptoms of nausea and chills have progressively worsened for the last 14 hours.

## 2017-11-02 NOTE — ED Provider Notes (Signed)
[facility]-EMERGENCY DEPT Provider Note   CSN: [id number] Arrival date & time: 11/02/17  1640     History   Chief Complaint Chief Complaint  Patient presents with  . Abdominal Pain  . Sent fro UC    R/o Appendicitis     HPI Travis Sawyer is a 42 y.o. male hx of HTN, HL, here presenting with right lower quadrant pain.  Patient states that he had periumbilical and right lower quadrant pain that started at midnight yesterday.  He states that he took some Tylenol but was unable to sleep due to the pain.  Had low-grade temperature 99 at home.  He also felt nauseated as well.  Denies any vomiting and denies any constipation or diarrhea.  Patient went to urgent care was sent here for rule out appendicitis.  Patient states that he has hypertension and high cholesterol but otherwise healthy.  The history is provided by the patient.    Past Medical History:  Diagnosis Date  . Hyperlipidemia   . Hypertension   . Thyroid disease     Patient Active Problem List   Diagnosis Date Noted  . Acute pain of right shoulder 10/03/2017  . Hypothyroidism 03/18/2011  . Mixed hyperlipidemia 03/18/2011  . Hyperglycemia 03/18/2011  . Fatty liver disease, nonalcoholic 03/18/2011  . Routine general medical examination at a health care facility 03/11/2011    Past Surgical History:  Procedure Laterality Date  . VASECTOMY          Home Medications    Prior to Admission medications   Medication Sig Start Date End Date Taking? Authorizing Provider  acetaminophen (TYLENOL) 500 MG tablet Take 1,000 mg by mouth every 6 (six) hours as needed for mild pain, moderate pain or headache.   Yes [provider]  levothyroxine (SYNTHROID, LEVOTHROID) 150 MCG tablet Take 1 tablet (150 mcg total) by mouth daily. 07/15/17  Yes Etta Grandchild, MD  Diclofenac Sodium (PENNSAID) 2 % SOLN Place 1 application onto the skin 2 (two) times daily. [phone] (Mobile) Patient not taking:  Reported on 11/02/2017 10/03/17   Myra Rude, MD  meloxicam (MOBIC) 15 MG tablet Take 1 tablet (15 mg total) by mouth daily. Patient not taking: Reported on 11/02/2017 10/03/17   Myra Rude, MD    Family History Family History  Problem Relation Age of Onset  . Diabetes Mother   . Diabetes Father   . Hypertension Father   . Cancer Neg Hx   . Hyperlipidemia Neg Hx   . Heart disease Neg Hx   . Mental illness Neg Hx   . Early death Neg Hx   . Kidney disease Neg Hx   . Stroke Neg Hx     Social History Social History   Tobacco Use  . Smoking status: Never Smoker  . Smokeless tobacco: Never Used  Substance Use Topics  . Alcohol use: Yes    Alcohol/week: 3.0 standard drinks    Types: 3 Glasses of wine per week  . Drug use: No     Allergies   Patient has no known allergies.   Review of Systems Review of Systems  Gastrointestinal: Positive for abdominal pain.  All other systems reviewed and are negative.    Physical Exam Updated Vital Signs BP (!) 148/98 (BP Location: Right Arm)   Pulse 92   Temp 98.4 F (36.9 C) (Oral)   Resp 19   SpO2 95%   Physical Exam  Constitutional: He is oriented to person,  place, and time. He appears well-developed and well-nourished.  HENT:  Head: Normocephalic.  Mouth/Throat: Oropharynx is clear and moist.  Eyes: Pupils are equal, round, and reactive to light. EOM are normal.  Cardiovascular: Normal rate.  Pulmonary/Chest: Effort normal and breath sounds normal.  Abdominal: Normal appearance.  RLQ tenderness, mild rebound and guarding   Neurological: He is alert and oriented to person, place, and time.  Skin: Skin is warm. Capillary refill takes less than 2 seconds.  Psychiatric: He has a normal mood and affect. His behavior is normal.  Nursing note and vitals reviewed.    ED Treatments / Results  Labs (all labs ordered are listed, but only abnormal results are displayed) Labs Reviewed  COMPREHENSIVE METABOLIC  PANEL - Abnormal; Notable for the following components:      Result Value   Glucose, Bld 171 (*)    Total Bilirubin 1.3 (*)    All other components within normal limits  CBC - Abnormal; Notable for the following components:   WBC 11.4 (*)    MCH 25.1 (*)    All other components within normal limits  I-STAT CHEM 8, ED - Abnormal; Notable for the following components:   Glucose, Bld 168 (*)    Calcium, Ion 1.10 (*)    All other components within normal limits  LIPASE, BLOOD  URINALYSIS, ROUTINE W REFLEX MICROSCOPIC    EKG None  Radiology Ct Abdomen Pelvis W Contrast  Result Date: 11/02/2017 CLINICAL DATA:  42 y/o M; right lower quadrant abdominal pain. Progressive nausea and chills. EXAM: CT ABDOMEN AND PELVIS WITH CONTRAST TECHNIQUE: Multidetector CT imaging of the abdomen and pelvis was performed using the standard protocol following bolus administration of intravenous contrast. CONTRAST:  ISOVUE-300 IOPAMIDOL (ISOVUE-300) INJECTION 61% COMPARISON:  None. FINDINGS: Lower chest: No acute abnormality. Hepatobiliary: No focal liver abnormality is seen. No gallstones, gallbladder wall thickening, or biliary dilatation. Pancreas: Unremarkable. No pancreatic ductal dilatation or surrounding inflammatory changes. Spleen: Normal in size without focal abnormality. Adrenals/Urinary Tract: Adrenal glands are unremarkable. 12 mm cyst within the right kidney lower pole subcentimeter cyst in the left kidney lower pole. Left kidney lower pole punctate nonobstructing stone. No ureter stone or hydronephrosis. Normal bladder. Stomach/Bowel: Normal appearance of stomach, small bowel, and colon excepting below. Acute appendicitis. Appendix: Location: Retrocecal Diameter: 12 mm Appendicolith: Negative. Mucosal hyper-enhancement: Positive. Extraluminal gas: Negative. Periappendiceal collection: Periappendiceal edema without rim enhancing collection. Vascular/Lymphatic: No significant vascular findings are  present. No enlarged abdominal or pelvic lymph nodes. Reproductive: Prostate is unremarkable. Other: Small left inguinal hernia containing fat. Musculoskeletal: No fracture is seen. IMPRESSION: 1. Acute appendicitis. No findings of perforation or abscess at this time. 2. Left kidney lower pole punctate nonobstructing stone. 3. Small left inguinal hernia containing fat. These results were called by telephone at the time of interpretation on 11/02/2017 at 11:10 pm to Dr. Chaney Malling , who verbally acknowledged these results. Electronically Signed   By: Mitzi Hansen M.D.   On: 11/02/2017 23:12    Procedures Procedures (including critical care time)  Medications Ordered in ED Medications  iopamidol (ISOVUE-300) 61 % injection (has no administration in time range)  sodium chloride 0.9 % injection (has no administration in time range)  sodium chloride 0.9 % bolus 1,000 mL (1,000 mLs Intravenous New Bag/Given 11/02/17 2154)  morphine 4 MG/ML injection 4 mg (4 mg Intravenous Given 11/02/17 2156)  ondansetron (ZOFRAN) injection 4 mg (4 mg Intravenous Given 11/02/17 2155)  iopamidol (ISOVUE-300) 61 % injection 100  mL (100 mLs Intravenous Contrast Given 11/02/17 2245)     Initial Impression / Assessment and Plan / ED Course  I have reviewed the triage vital signs and the nursing notes.  Pertinent labs & imaging results that were available during my care of the patient were reviewed by me and considered in my medical decision making (see chart for details).     Travis Sawyer is a [age] y.o. male here with RLQ pain since yesterday. I think likely appendcitis. Will get labs, UA, CT ab/pel.   11:18 PM WBC 11. CT showed acute appendicitis, no abscess. I talked to Dr. Johna SheriffHoxworth, who will see patient and admit for appendectomy.    Final Clinical Impressions(s) / ED Diagnoses   Final diagnoses:  None    ED Discharge Orders    None       Charlynne PanderYao, Trystan Eads Hsienta, MD 11/02/17 2319

## 2017-11-03 ENCOUNTER — Observation Stay (HOSPITAL_COMMUNITY): Payer: Managed Care, Other (non HMO) | Admitting: Anesthesiology

## 2017-11-03 ENCOUNTER — Other Ambulatory Visit: Payer: Self-pay

## 2017-11-03 ENCOUNTER — Encounter (HOSPITAL_COMMUNITY)
Admission: EM | Disposition: A | Discharge status: Home / Self Care | Payer: Self-pay | Source: Home / Self Care | Attending: Emergency Medicine

## 2017-11-03 ENCOUNTER — Encounter (HOSPITAL_COMMUNITY): Payer: Self-pay

## 2017-11-03 DIAGNOSIS — K3580 Unspecified acute appendicitis: Secondary | ICD-10-CM | POA: Diagnosis present

## 2017-11-03 HISTORY — PX: LAPAROSCOPIC APPENDECTOMY: SHX408

## 2017-11-03 LAB — SURGICAL PCR SCREEN
MRSA, PCR: NEGATIVE
Staphylococcus aureus: POSITIVE — AB

## 2017-11-03 LAB — HIV ANTIBODY (ROUTINE TESTING W REFLEX): HIV SCREEN 4TH GENERATION: NONREACTIVE

## 2017-11-03 SURGERY — APPENDECTOMY, LAPAROSCOPIC
Anesthesia: General

## 2017-11-03 MED ORDER — MIDAZOLAM HCL 5 MG/5ML IJ SOLN
INTRAMUSCULAR | Status: DC | PRN
  Administered 2017-11-03: 2 mg via INTRAVENOUS

## 2017-11-03 MED ORDER — LACTATED RINGERS IV SOLN
INTRAVENOUS | Status: DC
  Administered 2017-11-03 (×2): via INTRAVENOUS

## 2017-11-03 MED ORDER — ALBUMIN HUMAN 5 % IV SOLN
INTRAVENOUS | Status: AC
  Filled 2017-11-03: qty 250

## 2017-11-03 MED ORDER — ACETAMINOPHEN 160 MG/5ML PO SOLN: 325.0000 mg | ORAL | Status: DC | PRN

## 2017-11-03 MED ORDER — LACTATED RINGERS IR SOLN
Status: DC | PRN
  Administered 2017-11-03: 1000 mL

## 2017-11-03 MED ORDER — BUPIVACAINE-EPINEPHRINE (PF) 0.25% -1:200000 IJ SOLN
INTRAMUSCULAR | Status: AC
  Filled 2017-11-03: qty 30

## 2017-11-03 MED ORDER — IBUPROFEN 200 MG PO TABS
600.0000 mg | ORAL_TABLET | Freq: Four times a day (QID) | ORAL | Status: DC | PRN
  Administered 2017-11-03: 600 mg via ORAL
  Filled 2017-11-03: qty 3

## 2017-11-03 MED ORDER — POTASSIUM CHLORIDE IN NACL 20-0.9 MEQ/L-% IV SOLN
INTRAVENOUS | Status: DC
  Administered 2017-11-03: 02:00:00 via INTRAVENOUS
  Filled 2017-11-03 (×2): qty 1000

## 2017-11-03 MED ORDER — LIDOCAINE HCL (PF) 1 % IJ SOLN
INTRAMUSCULAR | Status: DC | PRN
  Administered 2017-11-03: 10 mL

## 2017-11-03 MED ORDER — DEXAMETHASONE SODIUM PHOSPHATE 10 MG/ML IJ SOLN
INTRAMUSCULAR | Status: AC
  Filled 2017-11-03: qty 1

## 2017-11-03 MED ORDER — METRONIDAZOLE IN NACL 5-0.79 MG/ML-% IV SOLN
500.0000 mg | Freq: Three times a day (TID) | INTRAVENOUS | Status: DC
  Administered 2017-11-03 (×2): 500 mg via INTRAVENOUS
  Filled 2017-11-03 (×2): qty 100

## 2017-11-03 MED ORDER — SUCCINYLCHOLINE CHLORIDE 200 MG/10ML IV SOSY
PREFILLED_SYRINGE | INTRAVENOUS | Status: DC | PRN
  Administered 2017-11-03: 120 mg via INTRAVENOUS

## 2017-11-03 MED ORDER — LEVOTHYROXINE SODIUM 100 MCG IV SOLR
75.0000 ug | Freq: Every day | INTRAVENOUS | Status: DC
  Filled 2017-11-03: qty 5

## 2017-11-03 MED ORDER — HEPARIN SODIUM (PORCINE) 5000 UNIT/ML IJ SOLN
5000.0000 [IU] | Freq: Once | INTRAMUSCULAR | Status: AC
  Administered 2017-11-03: 5000 [IU] via SUBCUTANEOUS
  Filled 2017-11-03: qty 1

## 2017-11-03 MED ORDER — ROCURONIUM BROMIDE 10 MG/ML (PF) SYRINGE
PREFILLED_SYRINGE | INTRAVENOUS | Status: DC | PRN
  Administered 2017-11-03: 10 mg via INTRAVENOUS
  Administered 2017-11-03: 40 mg via INTRAVENOUS

## 2017-11-03 MED ORDER — LIDOCAINE 2% (20 MG/ML) 5 ML SYRINGE
INTRAMUSCULAR | Status: AC
  Filled 2017-11-03: qty 5

## 2017-11-03 MED ORDER — ACETAMINOPHEN 325 MG PO TABS: 325.0000 mg | ORAL_TABLET | ORAL | Status: DC | PRN

## 2017-11-03 MED ORDER — MEPERIDINE HCL 50 MG/ML IJ SOLN: 6.2500 mg | INTRAMUSCULAR | Status: DC | PRN

## 2017-11-03 MED ORDER — SUGAMMADEX SODIUM 200 MG/2ML IV SOLN
INTRAVENOUS | Status: DC | PRN
  Administered 2017-11-03: 200 mg via INTRAVENOUS

## 2017-11-03 MED ORDER — METOPROLOL TARTRATE 5 MG/5ML IV SOLN: 5.0000 mg | Freq: Four times a day (QID) | INTRAVENOUS | Status: DC | PRN

## 2017-11-03 MED ORDER — ONDANSETRON HCL 4 MG/2ML IJ SOLN: 4.0000 mg | Freq: Once | INTRAMUSCULAR | Status: DC | PRN

## 2017-11-03 MED ORDER — DEXAMETHASONE SODIUM PHOSPHATE 10 MG/ML IJ SOLN
INTRAMUSCULAR | Status: DC | PRN
  Administered 2017-11-03: 10 mg via INTRAVENOUS

## 2017-11-03 MED ORDER — OXYCODONE HCL 5 MG/5ML PO SOLN
5.0000 mg | Freq: Once | ORAL | Status: DC | PRN
  Filled 2017-11-03: qty 5

## 2017-11-03 MED ORDER — 0.9 % SODIUM CHLORIDE (POUR BTL) OPTIME
TOPICAL | Status: DC | PRN
  Administered 2017-11-03: 1000 mL

## 2017-11-03 MED ORDER — HEPARIN SODIUM (PORCINE) 5000 UNIT/ML IJ SOLN: 5000.0000 [IU] | Freq: Once | INTRAMUSCULAR | Status: DC

## 2017-11-03 MED ORDER — LIDOCAINE HCL (PF) 1 % IJ SOLN
INTRAMUSCULAR | Status: AC
  Filled 2017-11-03: qty 30

## 2017-11-03 MED ORDER — FENTANYL CITRATE (PF) 100 MCG/2ML IJ SOLN
INTRAMUSCULAR | Status: DC | PRN
  Administered 2017-11-03 (×2): 50 ug via INTRAVENOUS

## 2017-11-03 MED ORDER — MORPHINE SULFATE (PF) 2 MG/ML IV SOLN
2.0000 mg | INTRAVENOUS | Status: DC | PRN
  Administered 2017-11-03 (×2): 2 mg via INTRAVENOUS
  Filled 2017-11-03 (×2): qty 1

## 2017-11-03 MED ORDER — LEVOTHYROXINE SODIUM 75 MCG PO TABS
150.0000 ug | ORAL_TABLET | Freq: Every day | ORAL | Status: DC
  Administered 2017-11-04: 150 ug via ORAL
  Filled 2017-11-03: qty 2

## 2017-11-03 MED ORDER — OXYCODONE HCL 5 MG PO TABS: 5.0000 mg | ORAL_TABLET | ORAL | Status: DC | PRN

## 2017-11-03 MED ORDER — ONDANSETRON HCL 4 MG/2ML IJ SOLN
INTRAMUSCULAR | Status: AC
  Filled 2017-11-03: qty 2

## 2017-11-03 MED ORDER — ONDANSETRON HCL 4 MG/2ML IJ SOLN: 4.0000 mg | Freq: Four times a day (QID) | INTRAMUSCULAR | Status: DC | PRN

## 2017-11-03 MED ORDER — SODIUM CHLORIDE 0.9 % IV SOLN
2.0000 g | Freq: Every day | INTRAVENOUS | Status: DC
  Administered 2017-11-03: 2 g via INTRAVENOUS
  Filled 2017-11-03: qty 2

## 2017-11-03 MED ORDER — POTASSIUM CHLORIDE IN NACL 20-0.9 MEQ/L-% IV SOLN
INTRAVENOUS | Status: DC
  Administered 2017-11-03: via INTRAVENOUS
  Filled 2017-11-03 (×2): qty 1000

## 2017-11-03 MED ORDER — LIDOCAINE 2% (20 MG/ML) 5 ML SYRINGE
INTRAMUSCULAR | Status: DC | PRN
  Administered 2017-11-03: 100 mg via INTRAVENOUS

## 2017-11-03 MED ORDER — SUGAMMADEX SODIUM 200 MG/2ML IV SOLN
INTRAVENOUS | Status: AC
  Filled 2017-11-03: qty 2

## 2017-11-03 MED ORDER — ROCURONIUM BROMIDE 10 MG/ML (PF) SYRINGE
PREFILLED_SYRINGE | INTRAVENOUS | Status: AC
  Filled 2017-11-03: qty 10

## 2017-11-03 MED ORDER — MIDAZOLAM HCL 2 MG/2ML IJ SOLN
INTRAMUSCULAR | Status: AC
  Filled 2017-11-03: qty 2

## 2017-11-03 MED ORDER — FENTANYL CITRATE (PF) 100 MCG/2ML IJ SOLN
INTRAMUSCULAR | Status: AC
  Filled 2017-11-03: qty 2

## 2017-11-03 MED ORDER — PROPOFOL 10 MG/ML IV BOLUS
INTRAVENOUS | Status: DC | PRN
  Administered 2017-11-03: 200 mg via INTRAVENOUS

## 2017-11-03 MED ORDER — BUPIVACAINE-EPINEPHRINE 0.25% -1:200000 IJ SOLN
INTRAMUSCULAR | Status: DC | PRN
  Administered 2017-11-03: 10 mL

## 2017-11-03 MED ORDER — ACETAMINOPHEN 500 MG PO TABS
1000.0000 mg | ORAL_TABLET | Freq: Three times a day (TID) | ORAL | Status: DC
  Administered 2017-11-03 – 2017-11-04 (×3): 1000 mg via ORAL
  Filled 2017-11-03 (×3): qty 2

## 2017-11-03 MED ORDER — FENTANYL CITRATE (PF) 100 MCG/2ML IJ SOLN
25.0000 ug | INTRAMUSCULAR | Status: DC | PRN
  Administered 2017-11-03: 50 ug via INTRAVENOUS

## 2017-11-03 MED ORDER — FENTANYL CITRATE (PF) 100 MCG/2ML IJ SOLN
INTRAMUSCULAR | Status: AC
  Administered 2017-11-03: 50 ug via INTRAVENOUS
  Filled 2017-11-03: qty 2

## 2017-11-03 MED ORDER — PROPOFOL 10 MG/ML IV BOLUS
INTRAVENOUS | Status: AC
  Filled 2017-11-03: qty 20

## 2017-11-03 MED ORDER — MORPHINE SULFATE (PF) 2 MG/ML IV SOLN: 2.0000 mg | INTRAVENOUS | Status: DC | PRN

## 2017-11-03 MED ORDER — ONDANSETRON 4 MG PO TBDP: 4.0000 mg | ORAL_TABLET | Freq: Four times a day (QID) | ORAL | Status: DC | PRN

## 2017-11-03 MED ORDER — ONDANSETRON HCL 4 MG/2ML IJ SOLN
INTRAMUSCULAR | Status: DC | PRN
  Administered 2017-11-03: 4 mg via INTRAVENOUS

## 2017-11-03 MED ORDER — OXYCODONE HCL 5 MG PO TABS: 5.0000 mg | ORAL_TABLET | Freq: Once | ORAL | Status: DC | PRN

## 2017-11-03 MED ORDER — PHENYLEPHRINE 40 MCG/ML (10ML) SYRINGE FOR IV PUSH (FOR BLOOD PRESSURE SUPPORT)
PREFILLED_SYRINGE | INTRAVENOUS | Status: AC
  Filled 2017-11-03: qty 10

## 2017-11-03 SURGICAL SUPPLY — 41 items
APPLIER CLIP ROT 10 11.4 M/L (STAPLE)
APPLIER CLIP ROT 13.4 12 LRG (CLIP)
CABLE HIGH FREQUENCY MONO STRZ (ELECTRODE) ×3 IMPLANT
CLIP APPLIE ROT 10 11.4 M/L (STAPLE) IMPLANT
CLIP APPLIE ROT 13.4 12 LRG (CLIP) IMPLANT
COVER SURGICAL LIGHT HANDLE (MISCELLANEOUS) ×3 IMPLANT
CUTTER FLEX LINEAR 45M (STAPLE) ×3 IMPLANT
DECANTER SPIKE VIAL GLASS SM (MISCELLANEOUS) ×3 IMPLANT
DERMABOND ADVANCED (GAUZE/BANDAGES/DRESSINGS) ×2
DERMABOND ADVANCED .7 DNX12 (GAUZE/BANDAGES/DRESSINGS) ×1 IMPLANT
DRAPE LAPAROSCOPIC ABDOMINAL (DRAPES) ×3 IMPLANT
ELECT REM PT RETURN 15FT ADLT (MISCELLANEOUS) ×3 IMPLANT
ENDOLOOP SUT PDS II  0 18 (SUTURE)
ENDOLOOP SUT PDS II 0 18 (SUTURE) IMPLANT
GLOVE BIO SURGEON STRL SZ 6 (GLOVE) ×3 IMPLANT
GLOVE BIOGEL PI IND STRL 7.0 (GLOVE) ×2 IMPLANT
GLOVE BIOGEL PI INDICATOR 7.0 (GLOVE) ×4
GLOVE ECLIPSE 7.0 STRL STRAW (GLOVE) ×3 IMPLANT
GLOVE INDICATOR 6.5 STRL GRN (GLOVE) ×3 IMPLANT
GLOVE SURG SS PI 7.0 STRL IVOR (GLOVE) ×3 IMPLANT
GOWN STRL REUS W/ TWL LRG LVL3 (GOWN DISPOSABLE) ×1 IMPLANT
GOWN STRL REUS W/TWL 2XL LVL3 (GOWN DISPOSABLE) ×3 IMPLANT
GOWN STRL REUS W/TWL LRG LVL3 (GOWN DISPOSABLE) ×2
GOWN STRL REUS W/TWL XL LVL3 (GOWN DISPOSABLE) ×3 IMPLANT
KIT BASIN OR (CUSTOM PROCEDURE TRAY) ×3 IMPLANT
POUCH RETRIEVAL ECOSAC 10 (ENDOMECHANICALS) ×1 IMPLANT
POUCH RETRIEVAL ECOSAC 10MM (ENDOMECHANICALS) ×2
POUCH SPECIMEN RETRIEVAL 10MM (ENDOMECHANICALS) IMPLANT
RELOAD 45 VASCULAR/THIN (ENDOMECHANICALS) IMPLANT
RELOAD STAPLE TA45 3.5 REG BLU (ENDOMECHANICALS) ×3 IMPLANT
SCISSORS LAP 5X35 DISP (ENDOMECHANICALS) ×3 IMPLANT
SET IRRIG TUBING LAPAROSCOPIC (IRRIGATION / IRRIGATOR) ×3 IMPLANT
SHEARS HARMONIC ACE PLUS 36CM (ENDOMECHANICALS) ×3 IMPLANT
SLEEVE XCEL OPT CAN 5 100 (ENDOMECHANICALS) ×3 IMPLANT
SUT MNCRL AB 4-0 PS2 18 (SUTURE) ×3 IMPLANT
TOWEL OR 17X26 10 PK STRL BLUE (TOWEL DISPOSABLE) ×3 IMPLANT
TRAY FOLEY MTR SLVR 16FR STAT (SET/KITS/TRAYS/PACK) ×3 IMPLANT
TRAY LAPAROSCOPIC (CUSTOM PROCEDURE TRAY) ×3 IMPLANT
TROCAR BLADELESS OPT 5 100 (ENDOMECHANICALS) ×3 IMPLANT
TROCAR XCEL BLUNT TIP 100MML (ENDOMECHANICALS) ×3 IMPLANT
TUBING INSUF HEATED (TUBING) ×3 IMPLANT

## 2017-11-03 NOTE — Interval H&P Note (Signed)
History and Physical Interval Note:  11/03/2017 8:56 AM  Travis Sawyer  has presented today for surgery, with the diagnosis of appendicitis  The various methods of treatment have been discussed with the patient and family. After consideration of risks, benefits and other options for treatment, the patient has consented to  Procedure(s): APPENDECTOMY LAPAROSCOPIC (N/A) as a surgical intervention .  The patient's history has been reviewed, patient examined, no change in status, stable for surgery.  I have reviewed the patient's chart and labs.  Questions were answered to the patient's satisfaction.     Almond LintFaera Margurite Duffy

## 2017-11-03 NOTE — Anesthesia Postprocedure Evaluation (Signed)
Anesthesia Post Note  Patient: Travis Sawyer  Procedure(s) Performed: APPENDECTOMY LAPAROSCOPIC (N/A )     Patient location during evaluation: PACU Anesthesia Type: General Level of consciousness: awake and alert Pain management: pain level controlled Vital Signs Assessment: post-procedure vital signs reviewed and stable Respiratory status: spontaneous breathing, nonlabored ventilation, respiratory function stable and patient connected to nasal cannula oxygen Cardiovascular status: blood pressure returned to baseline and stable Postop Assessment: no apparent nausea or vomiting Anesthetic complications: no    Last Vitals:  Vitals:   11/03/17 1115 11/03/17 1130  BP: 128/82   Pulse: 87 83  Resp: 18 19  Temp:    SpO2: 98% 99%    Last Pain:  Vitals:   11/03/17 1115  TempSrc:   PainSc: 0-No pain                 Girolamo Lortie

## 2017-11-03 NOTE — Anesthesia Preprocedure Evaluation (Addendum)
Anesthesia Evaluation  Patient identified by MRN, date of birth, ID band Patient awake    Reviewed: Allergy & Precautions, H&P , NPO status , Patient's Chart, lab work & pertinent test results, reviewed documented beta blocker date and time   Airway Mallampati: III  TM Distance: >3 FB Neck ROM: full  Mouth opening: Limited Mouth Opening  Dental no notable dental hx. (+) Teeth Intact   Pulmonary neg pulmonary ROS,    Pulmonary exam normal breath sounds clear to auscultation       Cardiovascular Exercise Tolerance: Good hypertension, negative cardio ROS   Rhythm:regular Rate:Normal     Neuro/Psych negative neurological ROS  negative psych ROS   GI/Hepatic negative GI ROS, Neg liver ROS,   Endo/Other  Hypothyroidism Morbid obesity  Renal/GU negative Renal ROS  negative genitourinary   Musculoskeletal   Abdominal   Peds  Hematology negative hematology ROS (+)   Anesthesia Other Findings   Reproductive/Obstetrics negative OB ROS                            Anesthesia Physical Anesthesia Plan  ASA: II and emergent  Anesthesia Plan: General   Post-op Pain Management:    Induction: Intravenous  PONV Risk Score and Plan: 2 and Ondansetron, Treatment may vary due to age or medical condition and Dexamethasone  Airway Management Planned: Oral ETT and LMA  Additional Equipment:   Intra-op Plan:   Post-operative Plan: Extubation in OR  Informed Consent: I have reviewed the patients History and Physical, chart, labs and discussed the procedure including the risks, benefits and alternatives for the proposed anesthesia with the patient or authorized representative who has indicated his/her understanding and acceptance.   Dental Advisory Given  Plan Discussed with: CRNA, Anesthesiologist and Surgeon  Anesthesia Plan Comments: (  )        Anesthesia Quick Evaluation

## 2017-11-03 NOTE — Transfer of Care (Signed)
Immediate Anesthesia Transfer of Care Note  Patient: Travis Sawyer  Procedure(s) Performed: APPENDECTOMY LAPAROSCOPIC (N/A )  Patient Location: PACU  Anesthesia Type:General  Level of Consciousness: sedated  Airway & Oxygen Therapy: Patient Spontanous Breathing and Patient connected to face mask oxygen  Post-op Assessment: Report given to RN and Post -op Vital signs reviewed and stable  Post vital signs: Reviewed and stable  Last Vitals:  Vitals Value Taken Time  BP 132/89 11/03/2017 11:11 AM  Temp    Pulse    Resp 16 11/03/2017 11:12 AM  SpO2    Vitals shown include unvalidated device data.  Last Pain:  Vitals:   11/03/17 0700  TempSrc:   PainSc: Asleep      Patients Stated Pain Goal: 2 (11/03/17 0120)  Complications: No apparent anesthesia complications

## 2017-11-03 NOTE — Anesthesia Procedure Notes (Signed)
Procedure Name: Intubation Date/Time: 11/03/2017 9:41 AM Performed by: Lind Covert, CRNA Pre-anesthesia Checklist: Patient identified, Emergency Drugs available, Suction available, Patient being monitored and Timeout performed Patient Re-evaluated:Patient Re-evaluated prior to induction Oxygen Delivery Method: Circle system utilized Preoxygenation: Pre-oxygenation with 100% oxygen Induction Type: IV induction Laryngoscope Size: Mac and 4 Grade View: Grade II Tube type: Oral Tube size: 7.5 mm Number of attempts: 1 Airway Equipment and Method: Stylet Placement Confirmation: ETT inserted through vocal cords under direct vision,  positive ETCO2 and breath sounds checked- equal and bilateral Secured at: 22 cm Tube secured with: Tape Dental Injury: Teeth and Oropharynx as per pre-operative assessment

## 2017-11-03 NOTE — ED Notes (Signed)
ED TO INPATIENT HANDOFF REPORT  Name/Age/Gender Travis Sawyer 42 y.o. male  Code Status   Home/SNF/Other Home  Chief Complaint Possible Appedicitis   Level of Care/Admitting Diagnosis ED Disposition    ED Disposition Condition [facility] Area: [place] [[id number]]  Level of Care: Med-Surg [16]  Diagnosis: Acute appendicitis [633354]  Admitting Physician: Excell Seltzer [[id number]]  Attending Physician: CCS, MD [[id number]]  PT Class (Do Not Modify): Observation [104]  PT Acc Code (Do Not Modify): Observation [10022]       Medical History Past Medical History:  Diagnosis Date  . Hyperlipidemia   . Hypertension   . Thyroid disease     Allergies No Known Allergies  IV Location/Drains/Wounds Patient Lines/Drains/Airways Status   Active Line/Drains/Airways    Name:   Placement date:   Placement time:   Site:   Days:   Peripheral IV 11/02/17 Right Antecubital   11/02/17    2153    Antecubital   1          Labs/Imaging Results for orders placed or performed during the hospital encounter of 11/02/17 (from the past 48 hour(s))  Lipase, blood     Status: None   Collection Time: 11/02/17  5:53 PM  Result Value Ref Range   Lipase 32 11 - 51 U/L    Comment: Performed at [facility], [place] [address], [place], [place] [address]  Comprehensive metabolic panel     Status: Abnormal   Collection Time: 11/02/17  5:53 PM  Result Value Ref Range   Sodium 138 135 - 145 mmol/L   Potassium 3.6 3.5 - 5.1 mmol/L   Chloride 102 98 - 111 mmol/L   CO2 25 22 - 32 mmol/L   Glucose, Bld 171 (H) 70 - 99 mg/dL   BUN 10 6 - 20 mg/dL   Creatinine, Ser 1.11 0.61 - 1.24 mg/dL   Calcium 9.1 8.9 - 10.3 mg/dL   Total Protein 8.1 6.5 - 8.1 g/dL   Albumin 4.6 3.5 - 5.0 g/dL   AST 21 15 - 41 U/L   ALT 23 0 - 44 U/L   Alkaline Phosphatase 49 38 - 126 U/L   Total Bilirubin 1.3 (H) 0.3 - 1.2 mg/dL   GFR calc non Af Amer >60 >60 mL/min   GFR  calc Af Amer >60 >60 mL/min    Comment: (NOTE) The eGFR has been calculated using the CKD EPI equation. This calculation has not been validated in all clinical situations. eGFR's persistently <60 mL/min signify possible Chronic Kidney Disease.    Anion gap 11 5 - 15    Comment: Performed at [facility], [place] [address], [place], [place] [address]  CBC     Status: Abnormal   Collection Time: 11/02/17  5:53 PM  Result Value Ref Range   WBC 11.4 (H) 4.0 - 10.5 K/uL   RBC 5.69 4.22 - 5.81 MIL/uL   Hemoglobin 14.3 13.0 - 17.0 g/dL   HCT 44.4 39.0 - 52.0 %   MCV 78.0 78.0 - 100.0 fL   MCH 25.1 (L) 26.0 - 34.0 pg   MCHC 32.2 30.0 - 36.0 g/dL   RDW 14.5 11.5 - 15.5 %   Platelets 236 150 - 400 K/uL    Comment: Performed at [facility], [place] [address], [place], [place] [address]  Urinalysis, Routine w reflex microscopic     Status: None   Collection Time: 11/02/17  9:46  PM  Result Value Ref Range   Color, Urine YELLOW YELLOW   APPearance CLEAR CLEAR   Specific Gravity, Urine 1.011 1.005 - 1.030   pH 6.0 5.0 - 8.0   Glucose, UA NEGATIVE NEGATIVE mg/dL   Hgb urine dipstick NEGATIVE NEGATIVE   Bilirubin Urine NEGATIVE NEGATIVE   Ketones, ur NEGATIVE NEGATIVE mg/dL   Protein, ur NEGATIVE NEGATIVE mg/dL   Nitrite NEGATIVE NEGATIVE   Leukocytes, UA NEGATIVE NEGATIVE    Comment: Performed at [place] [address], [place], [place] [address]  I-stat chem 8, ed     Status: Abnormal   Collection Time: 11/02/17  9:53 PM  Result Value Ref Range   Sodium 137 135 - 145 mmol/L   Potassium 3.5 3.5 - 5.1 mmol/L   Chloride 100 98 - 111 mmol/L   BUN 7 6 - 20 mg/dL   Creatinine, Ser 1.10 0.61 - 1.24 mg/dL   Glucose, Bld 168 (H) 70 - 99 mg/dL   Calcium, Ion 1.10 (L) 1.15 - 1.40 mmol/L   TCO2 25 22 - 32 mmol/L   Hemoglobin 15.3 13.0 - 17.0 g/dL   HCT 45.0 39.0 - 52.0 %   Ct Abdomen Pelvis W Contrast  Result Date:  11/02/2017 CLINICAL DATA:  42 y/o M; right lower quadrant abdominal pain. Progressive nausea and chills. EXAM: CT ABDOMEN AND PELVIS WITH CONTRAST TECHNIQUE: Multidetector CT imaging of the abdomen and pelvis was performed using the standard protocol following bolus administration of intravenous contrast. CONTRAST:  174m ISOVUE-300 IOPAMIDOL (ISOVUE-300) INJECTION 61% COMPARISON:  None. FINDINGS: Lower chest: No acute abnormality. Hepatobiliary: No focal liver abnormality is seen. No gallstones, gallbladder wall thickening, or biliary dilatation. Pancreas: Unremarkable. No pancreatic ductal dilatation or surrounding inflammatory changes. Spleen: Normal in size without focal abnormality. Adrenals/Urinary Tract: Adrenal glands are unremarkable. 12 mm cyst within the right kidney lower pole subcentimeter cyst in the left kidney lower pole. Left kidney lower pole punctate nonobstructing stone. No ureter stone or hydronephrosis. Normal bladder. Stomach/Bowel: Normal appearance of stomach, small bowel, and colon excepting below. Acute appendicitis. Appendix: Location: Retrocecal Diameter: 12 mm Appendicolith: Negative. Mucosal hyper-enhancement: Positive. Extraluminal gas: Negative. Periappendiceal collection: Periappendiceal edema without rim enhancing collection. Vascular/Lymphatic: No significant vascular findings are present. No enlarged abdominal or pelvic lymph nodes. Reproductive: Prostate is unremarkable. Other: Small left inguinal hernia containing fat. Musculoskeletal: No fracture is seen. IMPRESSION: 1. Acute appendicitis. No findings of perforation or abscess at this time. 2. Left kidney lower pole punctate nonobstructing stone. 3. Small left inguinal hernia containing fat. These results were called by telephone at the time of interpretation on 11/02/2017 at 11:10 pm to Dr. DShirlyn Goltz, who verbally acknowledged these results. Electronically Signed   By: LKristine GarbeM.D.   On: 11/02/2017 23:12     Pending Labs Unresulted Labs (From admission, onward)    Start     Ordered   Signed and Held  HIV antibody (Routine Testing)  Once,   R     Signed and Held          Vitals/Pain Today's Vitals   11/02/17 1750 11/02/17 1751 11/02/17 2210 11/02/17 2238  BP: (!) 145/92  (!) 148/98   Pulse: (!) 102  92   Resp: 20  19   Temp: 98.4 F (36.9 C)     TempSrc: Oral     SpO2: 99%  95%   PainSc:  10-Worst pain ever  5     Isolation Precautions No active  isolations  Medications Medications  iopamidol (ISOVUE-300) 61 % injection (has no administration in time range)  sodium chloride 0.9 % injection (has no administration in time range)  sodium chloride 0.9 % bolus 1,000 mL (1,000 mLs Intravenous New Bag/Given 11/02/17 2154)  morphine 4 MG/ML injection 4 mg (4 mg Intravenous Given 11/02/17 2156)  ondansetron (ZOFRAN) injection 4 mg (4 mg Intravenous Given 11/02/17 2155)  iopamidol (ISOVUE-300) 61 % injection 100 mL (100 mLs Intravenous Contrast Given 11/02/17 2245)    Mobility walks

## 2017-11-03 NOTE — Op Note (Signed)
Appendectomy, Lap, Procedure Note  Indications: The patient presented with a history of right-sided abdominal pain. A CT revealed findings consistent with acute appendicitis.  Pre-operative Diagnosis: Acute appendicitis  Post-operative Diagnosis: Same  Surgeon: Almond LintFaera Matthewjames Petrasek   Assistants: n/a  Anesthesia: General endotracheal anesthesia  ASA Class: 2, E  Procedure Details  The patient was seen again in the Holding Room. The risks, benefits, complications, treatment options, and expected outcomes were discussed with the patient and/or family. The possibilities of perforation of viscus, bleeding, recurrent infection, the need for additional procedures, failure to diagnose a condition, and creating a complication requiring transfusion or operation were discussed. There was concurrence with the proposed plan and informed consent was obtained. The site of surgery was properly noted. The patient was taken to Operating Room, identified as Travis Sawyer and the procedure verified as laparoscopic appendectomy. A Time Out was held and the above information confirmed.  The patient was placed in the supine position and general anesthesia was induced, along with placement of orogastric tube, Venodyne boots, and a Foley catheter. The abdomen was prepped and draped in a sterile fashion. Local anesthetic was infiltrated in the infraumbilical region.  A 1.5 cm curvilinear transverse incision was made just below the umbilicus.  The Kelly clamp was used to spread the subcutaneous tissues.  The fascia was elevated with 2 Kocher clamps and incised with the #11 blade.  A Tresa EndoKelly was used to confirm entrance into the peritoneal cavity.  A pursestring suture was placed around the fascial incision.  The Hasson trocar was inserted into the abdomen and held in place with the tails of the suture.  The pneumoperitoneum was then established to steady pressure of 15 mmHg.     Additional 5 mm cannulas then placed in the left lower  quadrant of the abdomen and the suprapubic region under direct visualization.  A careful evaluation of the entire abdomen was carried out. The patient was placed in Trendelenburg and rotated to the left.  The small intestines were retracted in the cephalad and left lateral direction away from the pelvis and right lower quadrant. The patient was found to have an enlarged and inflamed appendix that was retrocecal. There was no evidence of perforation.  The appendix was carefully dissected. The appendix was was skeletonized with the harmonic scalpel.   The appendix was divided at its base using an endo-GIA stapler. Minimal appendiceal stump was left in place. The appendix was removed from the abdomen with an Endocatch bag through the left subcostal port.  There was no evidence of bleeding, leakage, or complication after division of the appendix. Irrigation was also performed and irrigate suctioned from the abdomen as well.  The 5 mm trocars were removed.  The pneumoperitoneum was evacuated from the abdomen.    The trocar site skin wounds were closed with 4-0 Monocryl and dressed with Dermabond.  Instrument, sponge, and needle counts were correct at the conclusion of the case.   Findings: The appendix was found to be retrocecal and inflamed. There were not signs of necrosis.  There was not perforation. There was not abscess formation.  Estimated Blood Loss:  Minimal                Specimens: appendix to pathology         Complications:  None; patient tolerated the procedure well.         Disposition: PACU - hemodynamically stable.         Condition: stable

## 2017-11-04 ENCOUNTER — Encounter (HOSPITAL_COMMUNITY): Payer: Self-pay | Admitting: General Surgery

## 2017-11-04 MED ORDER — TRAMADOL HCL 50 MG PO TABS: ORAL_TABLET | ORAL | 0 refills | Status: DC

## 2017-11-04 MED ORDER — DOCUSATE SODIUM 100 MG PO CAPS: 100.0000 mg | ORAL_CAPSULE | Freq: Every day | ORAL | Status: DC

## 2017-11-04 MED ORDER — POLYETHYLENE GLYCOL 3350 17 G PO PACK: 17.0000 g | PACK | Freq: Every day | ORAL | Status: DC

## 2017-11-04 MED ORDER — TRAMADOL HCL 50 MG PO TABS: 50.0000 mg | ORAL_TABLET | Freq: Four times a day (QID) | ORAL | Status: DC | PRN

## 2017-11-04 MED ORDER — DOCUSATE SODIUM 100 MG PO CAPS: ORAL_CAPSULE | ORAL | 0 refills | Status: DC

## 2017-11-04 MED ORDER — IBUPROFEN 200 MG PO TABS: ORAL_TABLET | ORAL | Status: DC

## 2017-11-04 MED ORDER — ACETAMINOPHEN 500 MG PO TABS: ORAL_TABLET | ORAL | 0 refills | Status: DC

## 2017-11-04 NOTE — Discharge Instructions (Signed)
Laparoscopic Appendectomy, Adult, Care After °These instructions give you information about caring for yourself after your procedure. Your doctor may also give you more specific instructions. Call your doctor if you have any problems or questions after your procedure. °Follow these instructions at home: °Medicines °· Take over-the-counter and prescription medicines only as told by your doctor. °· Do not drive for 24 hours if you received a sedative. °· Do not drive or use heavy machinery while taking prescription pain medicine. °· If you were prescribed an antibiotic medicine, take it as told by your doctor. Do not stop taking it even if you start to feel better. °Activity °· Do not lift anything that is heavier than 10 pounds (4.5 kg) for 3 weeks or as told by your doctor. °· Do not play contact sports for 3 weeks or as told by your doctor. °· Slowly return to your normal activities. °Bathing °· Keep your cuts from surgery (incisions) clean and dry. °? Gently wash the cuts with soap and water. °? Rinse the cuts with water until the soap is gone. °? Pat the cuts dry with a clean towel. Do not rub the cuts. °· You may take showers after 48 hours. °· Do not take baths, swim, or use a hot tub for 2 weeks or as told by your doctor. °Cut Care °· Follow instructions from your doctor about how to take care of your cuts. Make sure you: °? Wash your hands with soap and water before you change your bandage (dressing). If you do not have soap and water, use hand sanitizer. °? Change your bandage as told by your doctor. °? Leave stitches (sutures), skin glue, or skin tape (adhesive) strips in place. They may need to stay in place for 2 weeks or longer. If tape strips get loose and curl up, you may trim the loose edges. Do not remove tape strips completely unless your doctor says it is okay. °· Check your cuts every day for signs of infection. Check for: °? More redness, swelling, or pain. °? More fluid or  blood. °? Warmth. °? Pus or a bad smell. °Other Instructions °· If you were sent home with a drain, follow instructions from your doctor about how to use it and care for it. °· Take deep breaths. This helps to keep your lungs from getting swollen (inflamed). °· To help with constipation: °? Drink plenty of fluids. °? Eat plenty of fruits and vegetables. °· Keep all follow-up visits as told by your doctor. This is important. °Contact a doctor if: °· You have more redness, swelling, or pain around a cut from surgery. °· You have more fluid or blood coming from a cut. °· Your cut feels warm to the touch. °· You have pus or a bad smell coming from a cut or a bandage. °· The edges of a cut break open after the stitches have been taken out. °· You have pain in your shoulders that gets worse. °· You feel dizzy or you pass out (faint). °· You have shortness of breath. °· You keep feeling sick to your stomach (nauseous). °· You keep throwing up (vomiting). °· You get diarrhea or you cannot control your poop. °· You lose your appetite. °· You have swelling or pain in your legs. °Get help right away if: °· You have a fever. °· You get a rash. °· You have trouble breathing. °· You have sharp pains in your chest. °This information is not intended to replace advice given   to you by your health care provider. Make sure you discuss any questions you have with your health care provider. Document Released: 11/24/2008 Document Revised: 07/06/2015 Document Reviewed: 07/18/2014 Elsevier Interactive Patient Education  2018 [employer]   [facility] _____[facility] LAPAROSCOPIC SURGERY: POST OP INSTRUCTIONS Always review your discharge instruction sheet given to you by the facility where your surgery was performed. IF YOU HAVE DISABILITY OR FAMILY LEAVE FORMS, YOU MUST BRING THEM TO THE OFFICE FOR PROCESSING.   DO NOT GIVE THEM TO YOUR DOCTOR.  1. A prescription for pain medication may be given to you upon  discharge.  Take your pain medication as prescribed, if needed.  If narcotic pain medicine is not needed, then you may take acetaminophen (Tylenol) or ibuprofen (Advil) as needed. 2. Take your usually prescribed medications unless otherwise directed. 3. If you need a refill on your pain medication, please contact your pharmacy.  They will contact our office to request authorization. Prescriptions will not be filled after 5pm or on week-ends. 4. You should follow a light diet the first few days after arrival home, such as soup and crackers, etc.  Be sure to include lots of fluids daily. 5. Most patients will experience some swelling and bruising in the area of the incisions.  Ice packs will help.  Swelling and bruising can take several days to resolve.  6. It is common to experience some constipation if taking pain medication after surgery.  Increasing fluid intake and taking a stool softener (such as Colace) will usually help or prevent this problem from occurring.  A mild laxative (Milk of Magnesia or Miralax) should be taken according to package instructions if there are no bowel movements after 48 hours. 7. Unless discharge instructions indicate otherwise, you may remove your bandages 24-48 hours after surgery, and you may shower at that time.  You may have steri-strips (small skin tapes) in place directly over the incision.  These strips should be left on the skin for 7-10 days.  If your surgeon used skin glue on the incision, you may shower in 24 hours.  The glue will flake off over the next 2-3 weeks.  Any sutures or staples will be removed at the office during your follow-up visit. 8. ACTIVITIES:  You may resume regular (light) daily activities beginning the next day--such as daily self-care, walking, climbing stairs--gradually increasing activities as tolerated.  You may have sexual intercourse when it is comfortable.  Refrain from any heavy lifting or straining until approved by your doctor. a. You  may drive when you are no longer taking prescription pain medication, you can comfortably wear a seatbelt, and you can safely maneuver your car and apply brakes. b. RETURN TO WORK:  __________________________________________________________ 9. You should see your doctor in the office for a follow-up appointment approximately 2-3 weeks after your surgery.  Make sure that you call for this appointment within a day or two after you arrive home to insure a convenient appointment time. 10. OTHER INSTRUCTIONS: __________________________________________________________________________________________________________________________ __________________________________________________________________________________________________________________________ WHEN TO CALL YOUR DOCTOR: 1. Fever over 101.0 2. Inability to urinate 3. Continued bleeding from incision. 4. Increased pain, redness, or drainage from the incision. 5. Increasing abdominal pain  The clinic staff is available to answer your questions during regular business hours.  Please dont hesitate to call and ask to speak to one of the nurses for clinical concerns.  If you have a medical emergency, go to the nearest emergency room or call 911.  A surgeon  from [facility] is always on call at the hospital. [address], [place], [place]  [address] ? [address] [address], [place], [place]   [address] [phone] ? [phone] ? FAX [phone] Web site: [email or web address]  GETTING TO GOOD BOWEL HEALTH. Irregular bowel habits such as constipation and diarrhea can lead to many problems over time.  Having one soft bowel movement a day is the most important way to prevent further problems.  The anorectal canal is designed to handle stretching and feces to safely manage our ability to get rid of solid waste (feces, poop, stool) out of our body.  BUT, hard constipated stools can act like ripping concrete bricks and  diarrhea can be a burning fire to this very sensitive area of our body, causing inflamed hemorrhoids, anal fissures, increasing risk is perirectal abscesses, abdominal pain/bloating, an making irritable bowel worse.     The goal: ONE SOFT BOWEL MOVEMENT A DAY!  To have soft, regular bowel movements:   Drink at least 8 tall glasses of water a day.    Take plenty of fiber.  Fiber is the undigested part of plant food that passes into the colon, acting s natures broom to encourage bowel motility and movement.  Fiber can absorb and hold large amounts of water. This results in a larger, bulkier stool, which is soft and easier to pass. Work gradually over several weeks up to 6 servings a day of fiber (25g a day even more if needed) in the form of: o Vegetables -- Root (potatoes, carrots, turnips), leafy green (lettuce, salad greens, celery, spinach), or cooked high residue (cabbage, broccoli, etc) o Fruit -- Fresh (unpeeled skin & pulp), Dried (prunes, apricots, cherries, etc ),  or stewed ( applesauce)  o Whole grain breads, pasta, etc (whole wheat)  o Bran cereals   Bulking Agents -- This type of water-retaining fiber generally is easily obtained each day by one of the following:  o Psyllium bran -- The psyllium plant is remarkable because its ground seeds can retain so much water. This product is available as Metamucil, Konsyl, Effersyllium, Per Diem Fiber, or the less expensive generic preparation in drug and health food stores. Although labeled a laxative, it really is not a laxative.  o Methylcellulose -- This is another fiber derived from wood which also retains water. It is available as Citrucel. o Polyethylene Glycol - and artificial fiber commonly called Miralax or Glycolax.  It is helpful for people with gassy or bloated feelings with regular fiber o Flax Seed - a less gassy fiber than psyllium  No reading or other relaxing activity while on the toilet. If bowel movements take longer than 5  minutes, you are too constipated  AVOID CONSTIPATION.  High fiber and water intake usually takes care of this.  Sometimes a laxative is needed to stimulate more frequent bowel movements, but   Laxatives are not a good long-term solution as it can wear the colon out. o Osmotics (Milk of Magnesia, Fleets phosphosoda, Magnesium citrate, MiraLax, GoLytely) are safer than  o Stimulants (Senokot, Castor Oil, Dulcolax, Ex Lax)    o Do not take laxatives for more than 7days in a row.   IF SEVERELY CONSTIPATED, try a Bowel Retraining Program: o Do not use laxatives.  o Eat a diet high in roughage, such as bran cereals and leafy vegetables.  o Drink six (6) ounces of prune or apricot juice each morning.  o Eat two (2) large servings of  stewed fruit each day.  o Take one (1) heaping tablespoon of a psyllium-based bulking agent twice a day. Use sugar-free sweetener when possible to avoid excessive calories.  o Eat a normal breakfast.  o Set aside 15 minutes after breakfast to sit on the toilet, but do not strain to have a bowel movement.  o If you do not have a bowel movement by the third day, use an enema and repeat the above steps.   Controlling diarrhea o Switch to liquids and simpler foods for a few days to avoid stressing your intestines further. o Avoid dairy products (especially milk & ice cream) for a short time.  The intestines often can lose the ability to digest lactose when stressed. o Avoid foods that cause gassiness or bloating.  Typical foods include beans and other legumes, cabbage, broccoli, and dairy foods.  Every person has some sensitivity to other foods, so listen to our body and avoid those foods that trigger problems for you. o Adding fiber (Citrucel, Metamucil, psyllium, Miralax) gradually can help thicken stools by absorbing excess fluid and retrain the intestines to act more normally.  Slowly increase the dose over a few weeks.  Too much fiber too soon can backfire and cause  cramping & bloating. o Probiotics (such as active yogurt, Align, etc) may help repopulate the intestines and colon with normal bacteria and calm down a sensitive digestive tract.  Most studies show it to be of mild help, though, and such products can be costly. o Medicines: - Bismuth subsalicylate (ex. Kayopectate, Pepto Bismol) every 30 minutes for up to 6 doses can help control diarrhea.  Avoid if pregnant. - Loperamide (Immodium) can slow down diarrhea.  Start with two tablets (4mg  total) first and then try one tablet every 6 hours.  Avoid if you are having fevers or severe pain.  If you are not better or start feeling worse, stop all medicines and call your doctor for advice o Call your doctor if you are getting worse or not better.  Sometimes further testing (cultures, endoscopy, X-ray studies, bloodwork, etc) may be needed to help diagnose and treat the cause of the diarrhea.

## 2017-11-04 NOTE — Discharge Summary (Signed)
Physician Discharge Summary  Patient ID: Travis Sawyer MRN: [id number] DOB/AGE: Jul 29, 1975 42 y.o.  Admit date: 11/02/2017 Discharge date: 11/04/2017  Admission Diagnoses:  Acute appendicitis Thyroid disease Hypertension Hyperlipidemia BMI 33  Discharge Diagnoses:  Acute appendicitis Thyroid disease Hypertension Hyperlipidemia BMI 33  Active Problems:   Acute appendicitis   PROCEDURES: Laparoscopic appendectomy 11/03/2017 Dr. Marcina Millard Course: Patient about 24 hours ago developed the gradual onset of somewhat diffuse constant lower abdominal pain.  Hours later the pain migrated to the right lower quadrant and has been persistent and somewhat more severe.  Worse with movement.  He has had some nausea without vomiting.  No fever chills.  No change in normal bowel movements.  No urinary symptoms.  No history of similar symptoms or chronic GI complaints.  He was admitted with acute appendicitis and underwent laparoscopic appendectomy.  He tolerated the procedure well.  His diet was advanced he was mobilized, and tolerating p.o. pain medicines first postoperative morning.  He was discharged at that time. Follow-up scheduled as below.  Condition on discharge: Improved.  CBC Latest Ref Rng & Units 11/02/2017 11/02/2017 07/09/2016  WBC 4.0 - 10.5 K/uL - 11.4(H) 4.9  Hemoglobin 13.0 - 17.0 g/dL 40.9 81.1 91.4  Hematocrit 39.0 - 52.0 % 45.0 44.4 40.6  Platelets 150 - 400 K/uL - 236 226.0   CMP Latest Ref Rng & Units 11/02/2017 11/02/2017 07/14/2017  Glucose 70 - 99 mg/dL 782(N) 562(Z) 95  BUN 6 - 20 mg/dL 7 10 11   Creatinine 0.61 - 1.24 mg/dL 3.08 6.57 8.46  Sodium 135 - 145 mmol/L 137 138 139  Potassium 3.5 - 5.1 mmol/L 3.5 3.6 4.3  Chloride 98 - 111 mmol/L 100 102 105  CO2 22 - 32 mmol/L - 25 27  Calcium 8.9 - 10.3 mg/dL - 9.1 9.4  Total Protein 6.5 - 8.1 g/dL - 8.1 7.1  Total Bilirubin 0.3 - 1.2 mg/dL - 1.3(H) 0.5  Alkaline Phos 38 - 126 U/L - 49 43  AST 15 - 41 U/L -  21 14  ALT 0 - 44 U/L - 23 17    Disposition: Home   Allergies as of 11/04/2017   No Known Allergies     Medication List    STOP taking these medications   Diclofenac Sodium 2 % Soln   meloxicam 15 MG tablet Commonly known as:  MOBIC     TAKE these medications   acetaminophen 500 MG tablet Commonly known as:  TYLENOL You can take 2 tablets every 6 hours as needed for pain.  You can alternate this with the ibuprofen, or the Ultram.  You can buy this over-the-counter at any drugstore.  Do not take more than 4000 mg of Tylenol(acetaminophen) per day.  It can harm your liver. What changed:    how much to take  how to take this  when to take this  reasons to take this  additional instructions   docusate sodium 100 MG capsule Commonly known as:  COLACE You can buy this in any drugstore over-the-counter and follow the package directions.   ibuprofen 200 MG tablet Commonly known as:  ADVIL,MOTRIN You can take 2 to 3 tablets every 6 hours as needed.  Alternate with plain Tylenol and/or Ultram.  You can buy this over-the-counter at any drugstore without a prescription.   levothyroxine 150 MCG tablet Commonly known as:  SYNTHROID, LEVOTHROID Take 1 tablet (150 mcg total) by mouth daily.   traMADol 50 MG tablet Commonly  known as:  ULTRAM You take 1 every 6 hours as needed for pain not relieved by Tylenol and ibuprofen.      Follow-up Information    [facility] 11[facility]18/2017.   Specialty:  General Surgery Why:  Your appointment is at 9:15 AM.  Be at the office 30 minutes early for check-in.  Bring photo ID and insurance information. Contact information: [address] [place] [address] [phone]        Etta GrandchildJones, Thomas L, MD Follow up.   Specialty:  Internal Medicine Why:  Call with them know you had surgery and follow-up for primary medical care. Contact information: [address] [place] [place]  [phone]           Signed: Sherrie GeorgeJENNINGS,Jonice Cerra 11/04/2017, 3:42 PM

## 2017-11-04 NOTE — Progress Notes (Signed)
1 Day Post-Op    CC: Abdominal pain  Subjective: Aside from sore he is doing well.  Tolerating diet's been up and around.  Pain has been fairly well tolerated this a.m.  Objective: Vital signs in last 24 hours: Temp:  [98.1 F (36.7 C)-99.1 F (37.3 C)] 98.3 F (36.8 C) (09/24 0550) Pulse Rate:  [82-94] 82 (09/24 0550) Resp:  [16-21] 18 (09/24 0550) BP: (119-159)/(72-90) 119/72 (09/24 0550) SpO2:  [94 %-100 %] 96 % (09/24 0550) Last BM Date: 11/02/17 600 p.o. 3300 IV 2915 urine Afebrile vital signs are stable No labs  Intake/Output from previous day: 09/23 0701 - 09/24 0700 In: 4029.6 [P.O.:600; I.V.:3329.6; IV Piggyback:100] Out: 2925 [Urine:2915; Blood:10] Intake/Output this shift: No intake/output data recorded.  General appearance: alert, cooperative and no distress Resp: clear to auscultation bilaterally GI: Soft, sore, sites all look good.  Lab Results:  Recent Labs    11/02/17 1753 11/02/17 2153  WBC 11.4*  --   HGB 14.3 15.3  HCT 44.4 45.0  PLT 236  --     BMET Recent Labs    11/02/17 1753 11/02/17 2153  NA 138 137  K 3.6 3.5  CL 102 100  CO2 25  --   GLUCOSE 171* 168*  BUN 10 7  CREATININE 1.11 1.10  CALCIUM 9.1  --    PT/INR No results for input(s): LABPROT, INR in the last 72 hours.  Recent Labs  Lab 11/02/17 1753  AST 21  ALT 23  ALKPHOS 49  BILITOT 1.3*  PROT 8.1  ALBUMIN 4.6     Lipase     Component Value Date/Time   LIPASE 32 11/02/2017 1753     Medications: . acetaminophen  1,000 mg Oral Q8H  . docusate sodium  100 mg Oral Daily  . heparin  5,000 Units Subcutaneous Once  . levothyroxine  150 mcg Oral QAC breakfast  . polyethylene glycol  17 g Oral Daily   . 0.9 % NaCl with KCl 20 mEq / L 100 mL/hr at 11/04/17 0600   Anti-infectives (From admission, onward)   Start     Dose/Rate Route Frequency Ordered Stop   11/03/17 0200  metroNIDAZOLE (FLAGYL) IVPB 500 mg  Status:  Discontinued     500 mg 100 mL/hr over 60  Minutes Intravenous Every 8 hours 11/03/17 0102 11/03/17 1225   11/03/17 0115  cefTRIAXone (ROCEPHIN) 2 g in sodium chloride 0.9 % 100 mL IVPB  Status:  Discontinued     2 g 200 mL/hr over 30 Minutes Intravenous Daily at bedtime 11/03/17 0102 11/03/17 1225     Prior to Admission medications   Medication Sig Start Date End Date Taking? Authorizing Provider  acetaminophen (TYLENOL) 500 MG tablet Take 1,000 mg by mouth every 6 (six) hours as needed for mild pain, moderate pain or headache.   Yes [provider]  levothyroxine (SYNTHROID, LEVOTHROID) 150 MCG tablet Take 1 tablet (150 mcg total) by mouth daily. 07/15/17  Yes Etta Grandchild, MD  Diclofenac Sodium (PENNSAID) 2 % SOLN Place 1 application onto the skin 2 (two) times daily. [phone] (Mobile) Patient not taking: Reported on 11/02/2017 10/03/17   Myra Rude, MD  meloxicam (MOBIC) 15 MG tablet Take 1 tablet (15 mg total) by mouth daily. Patient not taking: Reported on 11/02/2017 10/03/17   Myra Rude, MD     Assessment/Plan Thyroid disease Hypertension Hyperlipidemia BMI 33  Acute appendicitis Laparoscopic appendectomy, 11/03/2017 Dr. Almond Lint  FEN: IV fluids/regular diet  ID: Flagyl/Rocephin DVT: Heparin Follow-up: [facility]   Plan discharge home today.  LOS: 0 days    Travis Sawyer 11/04/2017 [phone]

## 2017-11-05 ENCOUNTER — Telehealth: Payer: Self-pay | Admitting: *Deleted

## 2017-11-05 NOTE — Telephone Encounter (Signed)
Pt was on TCM report admitted 11/02/17 for acute appendicitis and he underwent a laparoscopy appendectomy which he tolerated the procedure well Pt was D/C 11/04/17, and will follow-up w/[facility] on 11/11/17.Marland KitchenRaechel Chute

## 2018-02-19 ENCOUNTER — Other Ambulatory Visit: Payer: Self-pay | Admitting: Internal Medicine

## 2018-02-19 DIAGNOSIS — E039 Hypothyroidism, unspecified: Secondary | ICD-10-CM

## 2018-03-27 ENCOUNTER — Other Ambulatory Visit: Payer: Self-pay | Admitting: Internal Medicine

## 2018-03-27 DIAGNOSIS — E039 Hypothyroidism, unspecified: Secondary | ICD-10-CM

## 2018-03-27 MED ORDER — LEVOTHYROXINE SODIUM 150 MCG PO TABS: 150.0000 ug | ORAL_TABLET | Freq: Every day | ORAL | 1 refills | Status: DC

## 2018-03-27 NOTE — Telephone Encounter (Signed)
Due for appt in June  Requested Prescriptions  Pending Prescriptions Disp Refills  . levothyroxine (SYNTHROID, LEVOTHROID) 150 MCG tablet 90 tablet 1    Sig: Take 1 tablet (150 mcg total) by mouth daily.     Endocrinology:  Hypothyroid Agents Failed - 03/27/2018  9:16 AM      Failed - TSH needs to be rechecked within 3 months after an abnormal result. Refill until TSH is due.      Passed - TSH in normal range and within 360 days    TSH  Date Value Ref Range Status  07/14/2017 2.63 0.35 - 4.50 uIU/mL Final         Passed - Valid encounter within last 12 months    Recent Outpatient Visits          5 months ago Acute pain of right shoulder   [facility] -[facility]eline Mans, Marye Round, MD   8 months ago Fatty liver disease, nonalcoholic   [facility] -Madelin Rear, MD   1 year ago Other specified hypothyroidism   [facility] -Madelin Rear, MD   2 years ago Hyperglycemia   [facility] -Madelin Rear, MD   3 years ago Other specified hypothyroidism   [facility] -Madelin Rear, MD

## 2018-03-27 NOTE — Telephone Encounter (Signed)
Copied from CRM [id number]. Topic: Quick Communication - Rx Refill/Question >> Mar 27, 2018  9:04 AM Jolayne Haines L wrote: Medication: levothyroxine (SYNTHROID, LEVOTHROID) 150 MCG tablet  Has the patient contacted their pharmacy? Yes needs new script  (Agent: If no, request that the patient contact the pharmacy for the refill.) (Agent: If yes, when and what did the pharmacy advise?)  Preferred Pharmacy (with phone number or street name): [facility] [place], [place] - [address] [place] [address] Phone: [phone] Fax: [phone]    Agent: Please be advised that RX refills may take up to 3 business days. We ask that you follow-up with your pharmacy.

## 2018-09-09 ENCOUNTER — Other Ambulatory Visit: Payer: Self-pay

## 2018-09-09 DIAGNOSIS — Z20822 Contact with and (suspected) exposure to covid-19: Secondary | ICD-10-CM

## 2018-09-10 LAB — NOVEL CORONAVIRUS, NAA: SARS-CoV-2, NAA: NOT DETECTED

## 2018-10-05 ENCOUNTER — Other Ambulatory Visit: Payer: Self-pay | Admitting: Internal Medicine

## 2018-10-05 DIAGNOSIS — E039 Hypothyroidism, unspecified: Secondary | ICD-10-CM

## 2018-10-05 NOTE — Telephone Encounter (Signed)
Patient would like a refill on his levothyroxine (SYNTHROID, LEVOTHROID) 150 MCG tablet   medication and have it sent to his preferred pharmacy [facility].

## 2018-10-06 NOTE — Telephone Encounter (Signed)
Requested medication (s) are due for refill today: yes  Requested medication (s) are on the active medication list: yes  Last refill: 03/27/2018  Future visit scheduled: no  Notes to clinic:  Spoke with patient and advised him to contact office to schedule follow up.   Requested Prescriptions  Pending Prescriptions Disp Refills   levothyroxine (SYNTHROID) 150 MCG tablet 90 tablet 1    Sig: Take 1 tablet (150 mcg total) by mouth daily.     Endocrinology:  Hypothyroid Agents Failed - 10/05/2018  5:18 PM      Failed - TSH needs to be rechecked within 3 months after an abnormal result. Refill until TSH is due.      Failed - TSH in normal range and within 360 days    TSH  Date Value Ref Range Status  07/14/2017 2.63 0.35 - 4.50 uIU/mL Final         Failed - Valid encounter within last 12 months    Recent Outpatient Visits          1 year ago Acute pain of right shoulder   [place], Enid Baas, MD   1 year ago Fatty liver disease, nonalcoholic   Jennings, Thomas L, MD   2 years ago Other specified hypothyroidism   [place], Thomas L, MD   3 years ago Hyperglycemia   [place], Thomas L, MD   3 years ago Other specified hypothyroidism   [facility] -Mayer Camel, MD

## 2018-10-13 ENCOUNTER — Other Ambulatory Visit: Payer: Self-pay

## 2018-10-13 ENCOUNTER — Other Ambulatory Visit (INDEPENDENT_AMBULATORY_CARE_PROVIDER_SITE_OTHER): Payer: Managed Care, Other (non HMO)

## 2018-10-13 ENCOUNTER — Encounter: Payer: Self-pay | Admitting: Internal Medicine

## 2018-10-13 ENCOUNTER — Ambulatory Visit (INDEPENDENT_AMBULATORY_CARE_PROVIDER_SITE_OTHER): Payer: Managed Care, Other (non HMO) | Admitting: Internal Medicine

## 2018-10-13 VITALS — BP 156/92 | HR 63 | Temp 98.2°F | Resp 16 | Ht 68.0 in | Wt 225.0 lb

## 2018-10-13 DIAGNOSIS — E039 Hypothyroidism, unspecified: Secondary | ICD-10-CM | POA: Diagnosis not present

## 2018-10-13 DIAGNOSIS — E559 Vitamin D deficiency, unspecified: Secondary | ICD-10-CM

## 2018-10-13 DIAGNOSIS — Z0000 Encounter for general adult medical examination without abnormal findings: Secondary | ICD-10-CM | POA: Diagnosis not present

## 2018-10-13 DIAGNOSIS — K760 Fatty (change of) liver, not elsewhere classified: Secondary | ICD-10-CM

## 2018-10-13 DIAGNOSIS — E118 Type 2 diabetes mellitus with unspecified complications: Secondary | ICD-10-CM

## 2018-10-13 DIAGNOSIS — R7303 Prediabetes: Secondary | ICD-10-CM | POA: Diagnosis not present

## 2018-10-13 DIAGNOSIS — Z23 Encounter for immunization: Secondary | ICD-10-CM

## 2018-10-13 DIAGNOSIS — I10 Essential (primary) hypertension: Secondary | ICD-10-CM | POA: Diagnosis not present

## 2018-10-13 DIAGNOSIS — E785 Hyperlipidemia, unspecified: Secondary | ICD-10-CM

## 2018-10-13 LAB — CBC WITH DIFFERENTIAL/PLATELET
Basophils Absolute: 0 10*3/uL (ref 0.0–0.1)
Basophils Relative: 0.8 % (ref 0.0–3.0)
Eosinophils Absolute: 0.1 10*3/uL (ref 0.0–0.7)
Eosinophils Relative: 1.2 % (ref 0.0–5.0)
HCT: 43.9 % (ref 39.0–52.0)
Hemoglobin: 14.1 g/dL (ref 13.0–17.0)
Lymphocytes Relative: 38.4 % (ref 12.0–46.0)
Lymphs Abs: 2.3 10*3/uL (ref 0.7–4.0)
MCHC: 32.2 g/dL (ref 30.0–36.0)
MCV: 78.4 fl (ref 78.0–100.0)
Monocytes Absolute: 0.4 10*3/uL (ref 0.1–1.0)
Monocytes Relative: 6.4 % (ref 3.0–12.0)
Neutro Abs: 3.2 10*3/uL (ref 1.4–7.7)
Neutrophils Relative %: 53.2 % (ref 43.0–77.0)
Platelets: 229 10*3/uL (ref 150.0–400.0)
RBC: 5.6 Mil/uL (ref 4.22–5.81)
RDW: 15.7 % — ABNORMAL HIGH (ref 11.5–15.5)
WBC: 5.9 10*3/uL (ref 4.0–10.5)

## 2018-10-13 LAB — BASIC METABOLIC PANEL
BUN: 5 mg/dL — ABNORMAL LOW (ref 6–23)
CO2: 27 mEq/L (ref 19–32)
Calcium: 9.3 mg/dL (ref 8.4–10.5)
Chloride: 103 mEq/L (ref 96–112)
Creatinine, Ser: 1.13 mg/dL (ref 0.40–1.50)
GFR: 71.52 mL/min (ref >60.00)
Glucose, Bld: 87 mg/dL (ref 70–99)
Potassium: 4.2 mEq/L (ref 3.5–5.1)
Sodium: 138 mEq/L (ref 135–145)

## 2018-10-13 LAB — URINALYSIS, ROUTINE W REFLEX MICROSCOPIC
Bilirubin Urine: NEGATIVE
Hgb urine dipstick: NEGATIVE
Ketones, ur: NEGATIVE
Leukocytes,Ua: NEGATIVE
Nitrite: NEGATIVE
RBC / HPF: NONE SEEN (ref 0–?)
Specific Gravity, Urine: 1.01 (ref 1.000–1.030)
Total Protein, Urine: NEGATIVE
Urine Glucose: NEGATIVE
Urobilinogen, UA: 0.2 (ref 0.0–1.0)
WBC, UA: NONE SEEN (ref 0–?)
pH: 6 (ref 5.0–8.0)

## 2018-10-13 LAB — HEPATIC FUNCTION PANEL
ALT: 33 U/L (ref 0–53)
AST: 24 U/L (ref 0–37)
Albumin: 4.4 g/dL (ref 3.5–5.2)
Alkaline Phosphatase: 44 U/L (ref 39–117)
Bilirubin, Direct: 0.1 mg/dL (ref 0.0–0.3)
Total Bilirubin: 0.6 mg/dL (ref 0.2–1.2)
Total Protein: 7.5 g/dL (ref 6.0–8.3)

## 2018-10-13 LAB — LDL CHOLESTEROL, DIRECT: Direct LDL: 170 mg/dL

## 2018-10-13 LAB — LIPID PANEL
Cholesterol: 235 mg/dL — ABNORMAL HIGH (ref 0–200)
HDL: 32.8 mg/dL — ABNORMAL LOW (ref >39.00)
NonHDL: 202.33
Total CHOL/HDL Ratio: 7
Triglycerides: 216 mg/dL — ABNORMAL HIGH (ref 0.0–149.0)
VLDL: 43.2 mg/dL — ABNORMAL HIGH (ref 0.0–40.0)

## 2018-10-13 LAB — TSH: TSH: 26.19 u[IU]/mL — ABNORMAL HIGH (ref 0.35–4.50)

## 2018-10-13 LAB — HEMOGLOBIN A1C: Hgb A1c MFr Bld: 6.5 % (ref 4.6–6.5)

## 2018-10-13 LAB — VITAMIN D 25 HYDROXY (VIT D DEFICIENCY, FRACTURES): VITD: 12.41 ng/mL — ABNORMAL LOW (ref 30.00–100.00)

## 2018-10-13 NOTE — Patient Instructions (Signed)

## 2018-10-13 NOTE — Progress Notes (Signed)
Subjective:  Patient ID: Travis Sawyer, male    DOB: [birth year]  Age: 43 y.o. MRN: [id number]  CC: Hypothyroidism, Hyperlipidemia, Annual Exam, Hypertension, and Diabetes   HPI Travis Sawyer presents for a CPX.  He ran out of levothyroxine a week ago.  He complains of weight gain.  Outpatient Medications Prior to Visit  Medication Sig Dispense Refill   acetaminophen (TYLENOL) 500 MG tablet You can take 2 tablets every 6 hours as needed for pain.  You can alternate this with the ibuprofen, or the Ultram.  You can buy this over-the-counter at any drugstore.  Do not take more than 4000 mg of Tylenol(acetaminophen) per day.  It can harm your liver. 30 tablet 0   docusate sodium (COLACE) 100 MG capsule You can buy this in any drugstore over-the-counter and follow the package directions. 10 capsule 0   ibuprofen (ADVIL,MOTRIN) 200 MG tablet You can take 2 to 3 tablets every 6 hours as needed.  Alternate with plain Tylenol and/or Ultram.  You can buy this over-the-counter at any drugstore without a prescription.     levothyroxine (SYNTHROID, LEVOTHROID) 150 MCG tablet Take 1 tablet (150 mcg total) by mouth daily. (Patient not taking: Reported on 10/13/2018) 90 tablet 1   traMADol (ULTRAM) 50 MG tablet You take 1 every 6 hours as needed for pain not relieved by Tylenol and ibuprofen. 15 tablet 0   No facility-administered medications prior to visit.     ROS Review of Systems  Constitutional: Positive for unexpected weight change (wt gain). Negative for diaphoresis and fatigue.  HENT: Negative.   Eyes: Negative.  Negative for visual disturbance.  Respiratory: Negative for cough, chest tightness, shortness of breath and wheezing.   Cardiovascular: Negative for chest pain, palpitations and leg swelling.  Gastrointestinal: Negative for abdominal pain, constipation, diarrhea, nausea and vomiting.  Endocrine: Negative for heat intolerance, polydipsia, polyphagia and polyuria.  Genitourinary:  Negative.  Negative for difficulty urinating, penile swelling, scrotal swelling, testicular pain and urgency.  Musculoskeletal: Negative.  Negative for arthralgias and myalgias.  Skin: Negative.  Negative for rash.  Neurological: Negative for dizziness, weakness, light-headedness and headaches.  Hematological: Negative for adenopathy. Does not bruise/bleed easily.  Psychiatric/Behavioral: Negative.     Objective:  BP (!) 156/92 (BP Location: Left Arm, Patient Position: Sitting, Cuff Size: Large)    Pulse 63    Temp 98.2 F (36.8 C) (Oral)    Resp 16    Ht 5\' 8"  (1.727 m)    Wt 225 lb (102.1 kg)    SpO2 98%    BMI 34.21 kg/m   BP Readings from Last 3 Encounters:  10/13/18 (!) 156/92  11/04/17 119/72  10/03/17 126/76    Wt Readings from Last 3 Encounters:  10/13/18 225 lb (102.1 kg)  11/03/17 217 lb 13 oz (98.8 kg)  10/03/17 222 lb (100.7 kg)    Physical Exam Vitals signs reviewed.  Constitutional:      General: He is not in acute distress.    Appearance: He is obese. He is not ill-appearing, toxic-appearing or diaphoretic.  HENT:     Nose: Nose normal.     Mouth/Throat:     Mouth: Mucous membranes are moist.     Pharynx: No oropharyngeal exudate.  Eyes:     General: No scleral icterus.    Conjunctiva/sclera: Conjunctivae normal.  Neck:     Musculoskeletal: Normal range of motion and neck supple. No neck rigidity.  Cardiovascular:     Rate and Rhythm:  Normal rate and regular rhythm.     Heart sounds: No murmur.     Comments: EKG ---  Sinus  Rhythm  WITHIN NORMAL LIMITS  Pulmonary:     Effort: Pulmonary effort is normal. No respiratory distress.     Breath sounds: No stridor. No wheezing, rhonchi or rales.  Abdominal:     General: Abdomen is protuberant. Bowel sounds are normal. There is no distension.     Palpations: There is no hepatomegaly, splenomegaly or mass.     Tenderness: There is no abdominal tenderness.     Hernia: No hernia is present.    Musculoskeletal: Normal range of motion.     Right lower leg: No edema.     Left lower leg: No edema.  Skin:    General: Skin is warm and dry.  Neurological:     General: No focal deficit present.     Mental Status: He is alert.  Psychiatric:        Mood and Affect: Mood normal.        Behavior: Behavior normal.     Lab Results  Component Value Date   WBC 5.9 10/13/2018   HGB 14.1 10/13/2018   HCT 43.9 10/13/2018   PLT 229.0 10/13/2018   GLUCOSE 87 10/13/2018   CHOL 235 (H) 10/13/2018   TRIG 216.0 (H) 10/13/2018   HDL 32.80 (L) 10/13/2018   LDLDIRECT 170.0 10/13/2018   LDLCALC 130 (H) 07/14/2017   ALT 33 10/13/2018   AST 24 10/13/2018   NA 138 10/13/2018   K 4.2 10/13/2018   CL 103 10/13/2018   CREATININE 1.13 10/13/2018   BUN 5 (L) 10/13/2018   CO2 27 10/13/2018   TSH 26.19 (H) 10/13/2018   HGBA1C 6.5 10/13/2018    Ct Abdomen Pelvis W Contrast  Result Date: 11/02/2017 CLINICAL DATA:  43 y/o M; right lower quadrant abdominal pain. Progressive nausea and chills. EXAM: CT ABDOMEN AND PELVIS WITH CONTRAST TECHNIQUE: Multidetector CT imaging of the abdomen and pelvis was performed using the standard protocol following bolus administration of intravenous contrast. CONTRAST:  100mL ISOVUE-300 IOPAMIDOL (ISOVUE-300) INJECTION 61% COMPARISON:  None. FINDINGS: Lower chest: No acute abnormality. Hepatobiliary: No focal liver abnormality is seen. No gallstones, gallbladder wall thickening, or biliary dilatation. Pancreas: Unremarkable. No pancreatic ductal dilatation or surrounding inflammatory changes. Spleen: Normal in size without focal abnormality. Adrenals/Urinary Tract: Adrenal glands are unremarkable. 12 mm cyst within the right kidney lower pole subcentimeter cyst in the left kidney lower pole. Left kidney lower pole punctate nonobstructing stone. No ureter stone or hydronephrosis. Normal bladder. Stomach/Bowel: Normal appearance of stomach, small bowel, and colon excepting  below. Acute appendicitis. Appendix: Location: Retrocecal Diameter: 12 mm Appendicolith: Negative. Mucosal hyper-enhancement: Positive. Extraluminal gas: Negative. Periappendiceal collection: Periappendiceal edema without rim enhancing collection. Vascular/Lymphatic: No significant vascular findings are present. No enlarged abdominal or pelvic lymph nodes. Reproductive: Prostate is unremarkable. Other: Small left inguinal hernia containing fat. Musculoskeletal: No fracture is seen. IMPRESSION: 1. Acute appendicitis. No findings of perforation or abscess at this time. 2. Left kidney lower pole punctate nonobstructing stone. 3. Small left inguinal hernia containing fat. These results were called by telephone at the time of interpretation on 11/02/2017 at 11:10 pm to Dr. Chaney MallingAVID YAO , who verbally acknowledged these results. Electronically Signed   By: Mitzi HansenLance  Furusawa-Stratton M.D.   On: 11/02/2017 23:12    Assessment & Plan:   Travis BoucheLucas was seen today for hypothyroidism, hyperlipidemia, annual exam, hypertension and diabetes.  Diagnoses and all  orders for this visit:  Acquired hypothyroidism- His TSH is elevated at 26.  I have asked him to restart levothyroxine but at a higher dose. -     CBC with Differential/Platelet; Future -     TSH; Future -     levothyroxine (SYNTHROID) 200 MCG tablet; Take 1 tablet (200 mcg total) by mouth daily before breakfast.  Fatty liver disease, nonalcoholic- His liver enzymes are very mildly elevated.  I have asked him to work on his lifestyle modifications. -     Hepatic function panel; Future  Prediabetes- His A1c is up to 6.5%. -     Basic metabolic panel; Future -     Hemoglobin A1c; Future  Routine general medical examination at a health care facility- Exam completed, labs reviewed, vaccines reviewed and updated, patient education was given. -     Lipid panel; Future  Need for influenza vaccination -     Flu Vaccine QUAD 36+ mos IM  Need for Tdap vaccination -      Tdap vaccine greater than or equal to 7yo IM  Essential hypertension- His EKG is negative for LVH or ischemia.  I have asked him to start taking an ARB to treat this. -     VITAMIN D 25 Hydroxy (Vit-D Deficiency, Fractures); Future -     Urinalysis, Routine w reflex microscopic; Future -     EKG 12-Lead -     Azilsartan Medoxomil (EDARBI) 40 MG TABS; Take 1 tablet by mouth daily.  Type II diabetes mellitus with manifestations (HCC)- His A1c is at 6.5%.  Medical therapy is not indicated.  I have encouraged him to improve his lifestyle modifications. -     Azilsartan Medoxomil (EDARBI) 40 MG TABS; Take 1 tablet by mouth daily. -     HM Diabetes Foot Exam  Vitamin D deficiency disease -     Cholecalciferol 1.25 MG (50000 UT) capsule; Take 1 capsule (50,000 Units total) by mouth once a week.  Hyperlipidemia LDL goal <130- I have asked him to start taking a statin for CV risk reduction. -     rosuvastatin (CRESTOR) 20 MG tablet; Take 1 tablet (20 mg total) by mouth daily.   I have discontinued Travis Sawyer's acetaminophen, ibuprofen, docusate sodium, traMADol, and levothyroxine. I am also having him start on Cholecalciferol, levothyroxine, rosuvastatin, and Edarbi.  Meds ordered this encounter  Medications   Cholecalciferol 1.25 MG (50000 UT) capsule    Sig: Take 1 capsule (50,000 Units total) by mouth once a week.    Dispense:  12 capsule    Refill:  1   levothyroxine (SYNTHROID) 200 MCG tablet    Sig: Take 1 tablet (200 mcg total) by mouth daily before breakfast.    Dispense:  90 tablet    Refill:  0   rosuvastatin (CRESTOR) 20 MG tablet    Sig: Take 1 tablet (20 mg total) by mouth daily.    Dispense:  90 tablet    Refill:  1   Azilsartan Medoxomil (EDARBI) 40 MG TABS    Sig: Take 1 tablet by mouth daily.    Dispense:  90 tablet    Refill:  1     Follow-up: Return in about 3 months (around 01/12/2019).  Sanda Linger, MD

## 2018-10-14 ENCOUNTER — Telehealth: Payer: Self-pay | Admitting: Internal Medicine

## 2018-10-14 ENCOUNTER — Encounter: Payer: Self-pay | Admitting: Internal Medicine

## 2018-10-14 DIAGNOSIS — E118 Type 2 diabetes mellitus with unspecified complications: Secondary | ICD-10-CM | POA: Insufficient documentation

## 2018-10-14 DIAGNOSIS — E559 Vitamin D deficiency, unspecified: Secondary | ICD-10-CM | POA: Insufficient documentation

## 2018-10-14 DIAGNOSIS — E039 Hypothyroidism, unspecified: Secondary | ICD-10-CM

## 2018-10-14 MED ORDER — CHOLECALCIFEROL 1.25 MG (50000 UT) PO CAPS: 50000.0000 [IU] | ORAL_CAPSULE | ORAL | 1 refills | Status: DC

## 2018-10-14 MED ORDER — LEVOTHYROXINE SODIUM 200 MCG PO TABS: 200.0000 ug | ORAL_TABLET | Freq: Every day | ORAL | 1 refills | Status: DC

## 2018-10-14 MED ORDER — EDARBI 40 MG PO TABS: 1.0000 | ORAL_TABLET | Freq: Every day | ORAL | 1 refills | Status: DC

## 2018-10-14 MED ORDER — ROSUVASTATIN CALCIUM 20 MG PO TABS: 20.0000 mg | ORAL_TABLET | Freq: Every day | ORAL | 1 refills | Status: DC

## 2018-10-14 MED ORDER — LEVOTHYROXINE SODIUM 200 MCG PO TABS: 200.0000 ug | ORAL_TABLET | Freq: Every day | ORAL | 0 refills | Status: DC

## 2018-10-14 NOTE — Telephone Encounter (Signed)
Patient medication levothyroxine (SYNTHROID) 200 MCG  Was sent to the wrong pharmacy.  Patient will pick up the 30 day supply at [place]. [facility] pharmacy called stating they need a 71 day supply Fax # [phone]

## 2018-10-14 NOTE — Telephone Encounter (Signed)
erx sent as requested.  

## 2019-03-20 ENCOUNTER — Other Ambulatory Visit: Payer: Self-pay | Admitting: Internal Medicine

## 2019-03-20 DIAGNOSIS — E039 Hypothyroidism, unspecified: Secondary | ICD-10-CM

## 2019-05-07 ENCOUNTER — Ambulatory Visit: Payer: Managed Care, Other (non HMO) | Attending: Internal Medicine

## 2019-05-07 DIAGNOSIS — Z23 Encounter for immunization: Secondary | ICD-10-CM

## 2019-05-07 NOTE — Progress Notes (Signed)
   Covid-19 Vaccination Clinic  Name:  Chick Cousins    MRN: [id number] DOB: August 13, 1975  05/07/2019  Mr. Kanaan was observed post Covid-19 immunization for 15 minutes without incident. He was provided with Vaccine Information Sheet and instruction to access the V-Safe system.   Mr. Cayson was instructed to call 911 with any severe reactions post vaccine: Marland Kitchen Difficulty breathing  . Swelling of face and throat  . A fast heartbeat  . A bad rash all over body  . Dizziness and weakness   Immunizations Administered    Name Date Dose VIS Date Route   Pfizer COVID-19 Vaccine 05/07/2019 12:33 PM 0.3 mL 01/22/2019 Intramuscular   Manufacturer: [employer], [employer]   Lot: [id number]   NDC: [address]-5

## 2019-06-01 ENCOUNTER — Ambulatory Visit: Payer: Managed Care, Other (non HMO) | Attending: Internal Medicine

## 2019-06-01 DIAGNOSIS — Z23 Encounter for immunization: Secondary | ICD-10-CM

## 2019-06-01 NOTE — Progress Notes (Signed)
   Covid-19 Vaccination Clinic  Name:  Travis Sawyer    MRN: [id number] DOB: 09-17-1975  06/01/2019  [name] was observed post Covid-19 immunization for 15 minutes without incident. He was provided with Vaccine Information Sheet and instruction to access the V-Safe system.   [name] was instructed to call 911 with any severe reactions post vaccine: Marland Kitchen Difficulty breathing  . Swelling of face and throat  . A fast heartbeat  . A bad rash all over body  . Dizziness and weakness   Immunizations Administered    Name Date Dose VIS Date Route   Pfizer COVID-19 Vaccine 06/01/2019 11:57 AM 0.3 mL 04/07/2018 Intramuscular   Manufacturer: [employer], [employer]   Lot: [id number]   NDC: [address]-3

## 2019-06-18 ENCOUNTER — Other Ambulatory Visit: Payer: Self-pay | Admitting: Internal Medicine

## 2019-06-18 DIAGNOSIS — E039 Hypothyroidism, unspecified: Secondary | ICD-10-CM

## 2019-06-28 ENCOUNTER — Telehealth: Payer: Self-pay | Admitting: Internal Medicine

## 2019-06-28 DIAGNOSIS — E039 Hypothyroidism, unspecified: Secondary | ICD-10-CM

## 2019-06-28 MED ORDER — LEVOTHYROXINE SODIUM 200 MCG PO TABS: 200.0000 ug | ORAL_TABLET | Freq: Every day | ORAL | 0 refills | Status: DC

## 2019-06-28 NOTE — Telephone Encounter (Signed)
    1.Medication Requested: levothyroxine (SYNTHROID) 200 MCG tablet  2. Pharmacy (Name, Street, [place]): [facility] - [place], [place] - [address]  3. On Med List: yes  4. Last Visit with PCP: 10/13/2018  5. Next visit date with PCP: 05/26   Agent: Please be advised that RX refills may take up to 3 business days. We ask that you follow-up with your pharmacy.

## 2019-06-28 NOTE — Telephone Encounter (Signed)
Follow up scheduled for 07/07/2019. Erx sent as requested.

## 2019-06-28 NOTE — Telephone Encounter (Signed)
Informed patient of message below. States that he thinks he has enough medication to last him until his appointment on 07/07/2019. States understanding and has no further questions.

## 2019-06-28 NOTE — Telephone Encounter (Signed)
Can you contact patient and inform that 30 day supply was sent to [facility]. 90 day to mail order can be sent after visit and lab results.

## 2019-07-07 ENCOUNTER — Other Ambulatory Visit: Payer: Self-pay

## 2019-07-07 ENCOUNTER — Encounter: Payer: Self-pay | Admitting: Internal Medicine

## 2019-07-07 ENCOUNTER — Ambulatory Visit: Payer: Managed Care, Other (non HMO) | Admitting: Internal Medicine

## 2019-07-07 VITALS — BP 144/98 | HR 72 | Temp 98.4°F | Ht 68.0 in | Wt 213.0 lb

## 2019-07-07 DIAGNOSIS — K760 Fatty (change of) liver, not elsewhere classified: Secondary | ICD-10-CM

## 2019-07-07 DIAGNOSIS — E039 Hypothyroidism, unspecified: Secondary | ICD-10-CM | POA: Diagnosis not present

## 2019-07-07 DIAGNOSIS — E782 Mixed hyperlipidemia: Secondary | ICD-10-CM

## 2019-07-07 DIAGNOSIS — Z23 Encounter for immunization: Secondary | ICD-10-CM | POA: Diagnosis not present

## 2019-07-07 DIAGNOSIS — I10 Essential (primary) hypertension: Secondary | ICD-10-CM | POA: Diagnosis not present

## 2019-07-07 DIAGNOSIS — E559 Vitamin D deficiency, unspecified: Secondary | ICD-10-CM

## 2019-07-07 DIAGNOSIS — E118 Type 2 diabetes mellitus with unspecified complications: Secondary | ICD-10-CM

## 2019-07-07 LAB — BASIC METABOLIC PANEL
BUN: 9 mg/dL (ref 6–23)
CO2: 29 mEq/L (ref 19–32)
Calcium: 9 mg/dL (ref 8.4–10.5)
Chloride: 104 mEq/L (ref 96–112)
Creatinine, Ser: 1.09 mg/dL (ref 0.40–1.50)
GFR: 74.29 mL/min (ref >60.00)
Glucose, Bld: 101 mg/dL — ABNORMAL HIGH (ref 70–99)
Potassium: 4.4 mEq/L (ref 3.5–5.1)
Sodium: 137 mEq/L (ref 135–145)

## 2019-07-07 LAB — CBC WITH DIFFERENTIAL/PLATELET
Basophils Absolute: 0 10*3/uL (ref 0.0–0.1)
Basophils Relative: 0.6 % (ref 0.0–3.0)
Eosinophils Absolute: 0.1 10*3/uL (ref 0.0–0.7)
Eosinophils Relative: 3 % (ref 0.0–5.0)
HCT: 40.8 % (ref 39.0–52.0)
Hemoglobin: 13.1 g/dL (ref 13.0–17.0)
Lymphocytes Relative: 40.7 % (ref 12.0–46.0)
Lymphs Abs: 1.8 10*3/uL (ref 0.7–4.0)
MCHC: 32.1 g/dL (ref 30.0–36.0)
MCV: 77.2 fl — ABNORMAL LOW (ref 78.0–100.0)
Monocytes Absolute: 0.3 10*3/uL (ref 0.1–1.0)
Monocytes Relative: 6.4 % (ref 3.0–12.0)
Neutro Abs: 2.2 10*3/uL (ref 1.4–7.7)
Neutrophils Relative %: 49.3 % (ref 43.0–77.0)
Platelets: 223 10*3/uL (ref 150.0–400.0)
RBC: 5.29 Mil/uL (ref 4.22–5.81)
RDW: 15.4 % (ref 11.5–15.5)
WBC: 4.5 10*3/uL (ref 4.0–10.5)

## 2019-07-07 LAB — HEPATIC FUNCTION PANEL
ALT: 21 U/L (ref 0–53)
AST: 16 U/L (ref 0–37)
Albumin: 4.3 g/dL (ref 3.5–5.2)
Alkaline Phosphatase: 50 U/L (ref 39–117)
Bilirubin, Direct: 0.1 mg/dL (ref 0.0–0.3)
Total Bilirubin: 0.5 mg/dL (ref 0.2–1.2)
Total Protein: 6.8 g/dL (ref 6.0–8.3)

## 2019-07-07 LAB — MICROALBUMIN / CREATININE URINE RATIO
Creatinine,U: 87.6 mg/dL
Microalb Creat Ratio: 0.8 mg/g (ref 0.0–30.0)
Microalb, Ur: <0.7 mg/dL (ref 0.0–1.9)

## 2019-07-07 LAB — HEMOGLOBIN A1C: Hgb A1c MFr Bld: 6.2 % (ref 4.6–6.5)

## 2019-07-07 LAB — LIPID PANEL
Cholesterol: 183 mg/dL (ref 0–200)
HDL: 31.3 mg/dL — ABNORMAL LOW (ref >39.00)
LDL Cholesterol: 117 mg/dL — ABNORMAL HIGH (ref 0–99)
NonHDL: 151.92
Total CHOL/HDL Ratio: 6
Triglycerides: 175 mg/dL — ABNORMAL HIGH (ref 0.0–149.0)
VLDL: 35 mg/dL (ref 0.0–40.0)

## 2019-07-07 LAB — TSH: TSH: 0.34 u[IU]/mL — ABNORMAL LOW (ref 0.35–4.50)

## 2019-07-07 LAB — PROTIME-INR
INR: 1 ratio (ref 0.8–1.0)
Prothrombin Time: 11.5 s (ref 9.6–13.1)

## 2019-07-07 MED ORDER — EDARBI 40 MG PO TABS: 1.0000 | ORAL_TABLET | Freq: Every day | ORAL | 0 refills | Status: DC

## 2019-07-07 MED ORDER — LEVOTHYROXINE SODIUM 175 MCG PO TABS: 175.0000 ug | ORAL_TABLET | Freq: Every day | ORAL | 0 refills | Status: DC

## 2019-07-07 NOTE — Patient Instructions (Signed)

## 2019-07-07 NOTE — Progress Notes (Signed)
Subjective:  Patient ID: Montell Leopard, male    DOB: Dec 16, 1975  Age: 44 y.o. MRN: [id number]  CC: Hyperlipidemia, Hypertension, Diabetes, and Hypothyroidism  This visit occurred during the SARS-CoV-2 public health emergency.  Safety protocols were in place, including screening questions prior to the visit, additional usage of staff PPE, and extensive cleaning of exam room while observing appropriate contact time as indicated for disinfecting solutions.    HPI Vishwa Dais presents for f/up - He has lost weight with lifestyle modifications.  He has felt well recently and offers no complaints.  He is not taking the ARB because he says he never got it from the pharmacy.  He has also decided not to take the statin or the vitamin D supplement.  Outpatient Medications Prior to Visit  Medication Sig Dispense Refill  . levothyroxine (SYNTHROID) 200 MCG tablet Take 1 tablet (200 mcg total) by mouth daily before breakfast. 30 tablet 0  . Azilsartan Medoxomil (EDARBI) 40 MG TABS Take 1 tablet by mouth daily. 90 tablet 1  . Cholecalciferol 1.25 MG (50000 UT) capsule Take 1 capsule (50,000 Units total) by mouth once a week. 12 capsule 1  . rosuvastatin (CRESTOR) 20 MG tablet Take 1 tablet (20 mg total) by mouth daily. 90 tablet 1   No facility-administered medications prior to visit.    ROS Review of Systems  Constitutional: Negative.  Negative for diaphoresis and fatigue.  HENT: Negative.   Respiratory: Negative for cough, shortness of breath and wheezing.   Cardiovascular: Negative for chest pain, palpitations and leg swelling.  Gastrointestinal: Negative for abdominal pain, constipation, diarrhea and nausea.  Endocrine: Negative for cold intolerance and heat intolerance.  Genitourinary: Negative.  Negative for difficulty urinating.  Musculoskeletal: Negative.   Skin: Negative.  Negative for color change, pallor and rash.  Neurological: Negative.  Negative for dizziness, weakness,  light-headedness and numbness.  Hematological: Negative for adenopathy. Does not bruise/bleed easily.  Psychiatric/Behavioral: Negative.     Objective:  BP (!) 144/98 (BP Location: Left Arm, Patient Position: Sitting, Cuff Size: Large)   Pulse 72   Temp 98.4 F (36.9 C) (Oral)   Ht 5\' 8"  (1.727 m)   Wt 213 lb (96.6 kg)   SpO2 99%   BMI 32.39 kg/m   BP Readings from Last 3 Encounters:  07/07/19 (!) 144/98  10/13/18 (!) 156/92  11/04/17 119/72    Wt Readings from Last 3 Encounters:  07/07/19 213 lb (96.6 kg)  10/13/18 225 lb (102.1 kg)  11/03/17 217 lb 13 oz (98.8 kg)    Physical Exam Vitals reviewed.  HENT:     Nose: Nose normal.  Eyes:     General: No scleral icterus.    Conjunctiva/sclera: Conjunctivae normal.  Cardiovascular:     Rate and Rhythm: Normal rate and regular rhythm.     Heart sounds: No murmur.  Pulmonary:     Effort: Pulmonary effort is normal.     Breath sounds: No stridor. No wheezing, rhonchi or rales.  Abdominal:     General: Abdomen is protuberant. Bowel sounds are normal. There is no distension.     Palpations: Abdomen is soft. There is no hepatomegaly, splenomegaly or mass.     Tenderness: There is no abdominal tenderness.  Musculoskeletal:        General: Normal range of motion.     Cervical back: Neck supple.     Right lower leg: No edema.     Left lower leg: No edema.  Lymphadenopathy:  Cervical: No cervical adenopathy.  Skin:    General: Skin is warm and dry.  Neurological:     General: No focal deficit present.     Mental Status: He is alert.  Psychiatric:        Mood and Affect: Mood normal.        Behavior: Behavior normal.     Lab Results  Component Value Date   WBC 4.5 07/07/2019   HGB 13.1 07/07/2019   HCT 40.8 07/07/2019   PLT 223.0 07/07/2019   GLUCOSE 101 (H) 07/07/2019   CHOL 183 07/07/2019   TRIG 175.0 (H) 07/07/2019   HDL 31.30 (L) 07/07/2019   LDLDIRECT 170.0 10/13/2018   LDLCALC 117 (H) 07/07/2019    ALT 21 07/07/2019   AST 16 07/07/2019   NA 137 07/07/2019   K 4.4 07/07/2019   CL 104 07/07/2019   CREATININE 1.09 07/07/2019   BUN 9 07/07/2019   CO2 29 07/07/2019   TSH 0.34 (L) 07/07/2019   INR 1.0 07/07/2019   HGBA1C 6.2 07/07/2019   MICROALBUR <0.7 07/07/2019    CT ABDOMEN PELVIS W CONTRAST  Result Date: 11/02/2017 CLINICAL DATA:  44 y/o M; right lower quadrant abdominal pain. Progressive nausea and chills. EXAM: CT ABDOMEN AND PELVIS WITH CONTRAST TECHNIQUE: Multidetector CT imaging of the abdomen and pelvis was performed using the standard protocol following bolus administration of intravenous contrast. CONTRAST:  ISOVUE-300 IOPAMIDOL (ISOVUE-300) INJECTION 61% COMPARISON:  None. FINDINGS: Lower chest: No acute abnormality. Hepatobiliary: No focal liver abnormality is seen. No gallstones, gallbladder wall thickening, or biliary dilatation. Pancreas: Unremarkable. No pancreatic ductal dilatation or surrounding inflammatory changes. Spleen: Normal in size without focal abnormality. Adrenals/Urinary Tract: Adrenal glands are unremarkable. 12 mm cyst within the right kidney lower pole subcentimeter cyst in the left kidney lower pole. Left kidney lower pole punctate nonobstructing stone. No ureter stone or hydronephrosis. Normal bladder. Stomach/Bowel: Normal appearance of stomach, small bowel, and colon excepting below. Acute appendicitis. Appendix: Location: Retrocecal Diameter: 12 mm Appendicolith: Negative. Mucosal hyper-enhancement: Positive. Extraluminal gas: Negative. Periappendiceal collection: Periappendiceal edema without rim enhancing collection. Vascular/Lymphatic: No significant vascular findings are present. No enlarged abdominal or pelvic lymph nodes. Reproductive: Prostate is unremarkable. Other: Small left inguinal hernia containing fat. Musculoskeletal: No fracture is seen. IMPRESSION: 1. Acute appendicitis. No findings of perforation or abscess at this time. 2. Left kidney  lower pole punctate nonobstructing stone. 3. Small left inguinal hernia containing fat. These results were called by telephone at the time of interpretation on 11/02/2017 at 11:10 pm to Dr. Chaney Malling , who verbally acknowledged these results. Electronically Signed   By: Mitzi Hansen M.D.   On: 11/02/2017 23:12    Assessment & Plan:   Rebel was seen today for hyperlipidemia, hypertension, diabetes and hypothyroidism.  Diagnoses and all orders for this visit:  Type II diabetes mellitus with manifestations (HCC)- His A1c is down to 6.2%.  His blood sugars are adequately well controlled with lifestyle modifications. -     Cancel: Basic metabolic panel; Future -     Cancel: Microalbumin / creatinine urine ratio; Future -     Cancel: Hemoglobin A1c; Future -     Basic metabolic panel; Future -     Hemoglobin A1c; Future -     Microalbumin / creatinine urine ratio; Future -     Discontinue: Azilsartan Medoxomil (EDARBI) 40 MG TABS; Take 1 tablet by mouth daily. -     Ambulatory referral to Ophthalmology -  Azilsartan Medoxomil (EDARBI) 40 MG TABS; Take 1 tablet by mouth daily. -     Microalbumin / creatinine urine ratio -     Hemoglobin A1c -     Basic metabolic panel  Essential hypertension- His blood pressure is not adequately well controlled.  I recommended that he start taking an ARB. -     CBC with Differential/Platelet; Future -     Cancel: Basic metabolic panel; Future -     Basic metabolic panel; Future -     Discontinue: Azilsartan Medoxomil (EDARBI) 40 MG TABS; Take 1 tablet by mouth daily. -     Azilsartan Medoxomil (EDARBI) 40 MG TABS; Take 1 tablet by mouth daily. -     Basic metabolic panel -     CBC with Differential/Platelet  Fatty liver disease, nonalcoholic- His LFTs are normal now.  He was praised for his lifestyle modifications. -     Cancel: Hepatic function panel; Future -     Cancel: Protime-INR; Future -     Hepatic function panel; Future -      Protime-INR; Future -     Protime-INR -     Hepatic function panel  Vitamin D deficiency disease  Mixed hyperlipidemia- He has a low ASCVD risk score so will hold off on prescribing a statin for now. -     Cancel: Lipid panel; Future -     Lipid panel; Future -     Lipid panel  Acquired hypothyroidism- His TSH is mildly suppressed.  I recommended that he lower his dose of levothyroxine. -     Cancel: TSH; Future -     TSH; Future -     TSH -     levothyroxine (SYNTHROID) 175 MCG tablet; Take 1 tablet (175 mcg total) by mouth daily before breakfast.  Need for pneumococcal vaccine -     Pneumococcal polysaccharide vaccine 23-valent greater than or equal to 2yo subcutaneous/IM   I have discontinued Jakeb Zea's Cholecalciferol, rosuvastatin, and levothyroxine. I am also having him start on levothyroxine. Additionally, I am having him maintain his [place].  Meds ordered this encounter  Medications  . DISCONTD: Azilsartan Medoxomil (EDARBI) 40 MG TABS    Sig: Take 1 tablet by mouth daily.    Dispense:  90 tablet    Refill:  0  . Azilsartan Medoxomil (EDARBI) 40 MG TABS    Sig: Take 1 tablet by mouth daily.    Dispense:  90 tablet    Refill:  0  . levothyroxine (SYNTHROID) 175 MCG tablet    Sig: Take 1 tablet (175 mcg total) by mouth daily before breakfast.    Dispense:  90 tablet    Refill:  0     Follow-up: Return in about 3 months (around 10/07/2019).  Scarlette Calico, MD

## 2019-07-15 ENCOUNTER — Telehealth: Payer: Self-pay | Admitting: Internal Medicine

## 2019-07-15 NOTE — Telephone Encounter (Signed)
    Pt c/o medication issue:  1. Name of Medication: Azilsartan Medoxomil (EDARBI) 40 MG TABS  2. How are you currently taking this medication (dosage and times per day)? n/a  3. Are you having a reaction (difficulty breathing--STAT)? no  4. What is your medication issue?  Call from [facility] stating medication not covered on plan.  Suggested alternative is Losartan,Valsartan, Irbesartan, Candesartan Please call [phone]. Ref # [id number]

## 2019-07-18 ENCOUNTER — Other Ambulatory Visit: Payer: Self-pay | Admitting: Internal Medicine

## 2019-07-18 DIAGNOSIS — I10 Essential (primary) hypertension: Secondary | ICD-10-CM

## 2019-07-18 DIAGNOSIS — E118 Type 2 diabetes mellitus with unspecified complications: Secondary | ICD-10-CM

## 2019-07-18 MED ORDER — IRBESARTAN 150 MG PO TABS: 150.0000 mg | ORAL_TABLET | Freq: Every day | ORAL | 1 refills | Status: DC

## 2019-07-19 ENCOUNTER — Encounter: Payer: Self-pay | Admitting: Internal Medicine

## 2019-08-02 ENCOUNTER — Telehealth: Payer: Self-pay | Admitting: Internal Medicine

## 2019-08-02 NOTE — Telephone Encounter (Signed)
   Patient calling, states on 07/07/19 he paid a $25 copay, he is now receiving a bill for another $25 copay, Patient states he contacted his insurance and billing department both state 2 claims were sent in for same date of service [facility] billing advised him to speak with office manager.

## 2019-08-12 ENCOUNTER — Ambulatory Visit: Payer: Self-pay

## 2019-08-12 ENCOUNTER — Ambulatory Visit (INDEPENDENT_AMBULATORY_CARE_PROVIDER_SITE_OTHER): Payer: Managed Care, Other (non HMO)

## 2019-08-12 ENCOUNTER — Ambulatory Visit: Payer: Managed Care, Other (non HMO) | Admitting: Family Medicine

## 2019-08-12 ENCOUNTER — Other Ambulatory Visit: Payer: Self-pay

## 2019-08-12 ENCOUNTER — Encounter: Payer: Self-pay | Admitting: Family Medicine

## 2019-08-12 VITALS — BP 102/70 | HR 71 | Ht 68.0 in | Wt 212.4 lb

## 2019-08-12 DIAGNOSIS — M25521 Pain in right elbow: Secondary | ICD-10-CM

## 2019-08-12 NOTE — Progress Notes (Signed)
    Subjective:    CC: R elbow pain  I, Travis Sawyer, LAT, ATC, am serving as scribe for Dr. [name].  HPI: Pt is a 44 y/o male presenting w/ c/o R elbow pain x 2 months w/ no known MOI.  He locates his pain to his R antecubital fossa.  He started a bit of different lifting program before the pain started but had been lifting since the fall.  He cannot recall any specific injury.  He works in Consulting civil engineer.  Radiating pain: intermittently into his R forearm and hand R elbow swelling: No Aggravating factors: lifting items of any weight; gripping Treatments tried: Motrin; relative rest  Pertinent review of Systems: No fevers or chills  Relevant historical information: Hypertension, hypothyroidism.   Objective:    Vitals:   08/12/19 0945  BP: 102/70  Pulse: 71  SpO2: 99%   General: Well Developed, well nourished, and in no acute distress.   MSK: Right elbow normal-appearing Normal motion. Mildly tender palpation in the cubital fossa near biceps tendon. Normal strength flexion extension pronation supination. Flexion slightly exacerbates pain.  No significant pain with supination.  Mild pain with pronation.  Lab and Radiology Results  Diagnostic Limited MSK Ultrasound of: Right elbow anterior Biceps tendon normal-appearing Anterior elbow structures normal-appearing. Medial epicondyle structures normal-appearing  Impression: Normal elbow ultrasound.  X-ray images right elbow obtained today personally and independently reviewed No acute fractures.  Normal-appearing elbow otherwise. Await formal radiology review    Impression and Recommendations:    Assessment and Plan: 44 y.o. male with right anterior elbow pain ongoing for about 2 months.  Etiology somewhat unclear at this time however likely tendinopathy.  Patient will benefit from trial of physical therapy and Voltaren gel.  If Voltaren gel not helpful will switch to the nitroglycerin patch.  Provided instructions on  nitroglycerin protocol.  Patient will call me once in a message if not doing well. Recheck in 6 weeks.  Return sooner if needed..   Orders Placed This Encounter  Procedures  . [place] LIMITED JOINT SPACE STRUCTURES UP RIGHT(NO LINKED CHARGES)    Order Specific Question:   Reason for Exam (SYMPTOM  OR DIAGNOSIS REQUIRED)    Answer:   R elbow pain    Order Specific Question:   Preferred imaging location?    Answer:   Adult [facility]  . DG ELBOW COMPLETE RIGHT (3+VIEW)    Standing Status:   Future    Number of Occurrences:   1    Standing Expiration Date:   08/11/2020    Order Specific Question:   Reason for Exam (SYMPTOM  OR DIAGNOSIS REQUIRED)    Answer:   eval anterior elbow pain    Order Specific Question:   Preferred imaging location?    Answer:   [facility]    Order Specific Question:   Radiology Contrast Protocol - do NOT remove file path    Answer:   \\[email or web address]  . Ambulatory referral to Physical Therapy    Referral Priority:   Routine    Referral Type:   Physical Medicine    Referral Reason:   Specialty Services Required    Requested Specialty:   Physical Therapy   No orders of the defined types were placed in this encounter.   Discussed warning signs or symptoms. Please see discharge instructions. Patient expresses understanding.   The above documentation has been reviewed and is accurate and complete [name], M.D.

## 2019-08-12 NOTE — Patient Instructions (Signed)
Thank you for coming in today. Plan for PT and voltaren gel upto 4x daily If not helpful let me know and I will add nitro patches.  Nitroglycerin Protocol   Apply 1/4 nitroglycerin patch to affected area daily.  Change position of patch within the affected area every 24 hours.  You may experience a headache during the first 1-2 weeks of using the patch, these should subside.  If you experience headaches after beginning nitroglycerin patch treatment, you may take your preferred over the counter pain reliever.  Another side effect of the nitroglycerin patch is skin irritation or rash related to patch adhesive.  Please notify our office if you develop more severe headaches or rash, and stop the patch.  Tendon healing with nitroglycerin patch may require 12 to 24 weeks depending on the extent of injury.  Men should not use if taking Viagra, Cialis, or Levitra.   Do not use if you have migraines or rosacea.   Get xray today.   Recheck in 6 weeks or so.   Let me know if you are having a hard time getting in with PT.

## 2019-08-13 NOTE — Progress Notes (Signed)
No fracture visible.  X-ray is normal.

## 2019-08-24 ENCOUNTER — Other Ambulatory Visit: Payer: Self-pay

## 2019-08-24 ENCOUNTER — Ambulatory Visit: Payer: Managed Care, Other (non HMO) | Attending: Family Medicine | Admitting: Physical Therapy

## 2019-08-24 ENCOUNTER — Encounter: Payer: Self-pay | Admitting: Physical Therapy

## 2019-08-24 DIAGNOSIS — M6281 Muscle weakness (generalized): Secondary | ICD-10-CM | POA: Diagnosis present

## 2019-08-24 DIAGNOSIS — M25521 Pain in right elbow: Secondary | ICD-10-CM | POA: Diagnosis present

## 2019-08-24 NOTE — Patient Instructions (Signed)
Access Code: [email or web address]: [email or web address]: 07/13/2021Prepared by: Victorino Dike PaaExercises  Forearm Supination with Dumbbell - 2 x daily - 7 x weekly - 2 sets - 10 reps - 3 hold  Forearm Pronation with Dumbbell - 2 x daily - 7 x weekly - 2 sets - 10 reps - 3 hold  Seated Wrist Extension Stretch - 2 x daily - 7 x weekly - 1 sets - 3 reps - 30 hold  Seated Wrist Flexion Stretch - 2 x daily - 7 x weekly - 1 sets - 3 reps - 30 hold

## 2019-08-24 NOTE — Therapy (Signed)
[facility] [address] [place], [place], [address] Phone: [phone]   Fax:  [phone]  Physical Therapy Evaluation  Patient Details  Name: Travis Sawyer MRN: [id number] Date of Birth: [birth year] Referring Provider (PT): Dr. [name]   Encounter Date: 08/24/2019   PT End of Session - 08/24/19 1319    Visit Number 1    Number of Visits 12    Date for PT Re-Evaluation 10/19/19    Authorization Type [employer]    PT Start Time 1216    PT Stop Time 1300    PT Time Calculation (min) 44 min    Activity Tolerance Patient tolerated treatment well    Behavior During Therapy [facility] for tasks assessed/performed           Past Medical History:  Diagnosis Date  . Hyperlipidemia   . Hypertension   . Hypothyroidism   . Thyroid disease     Past Surgical History:  Procedure Laterality Date  . JOINT REPLACEMENT     knee sx  . LAPAROSCOPIC APPENDECTOMY N/A 11/03/2017   Procedure: APPENDECTOMY LAPAROSCOPIC;  Surgeon: Almond Lint, MD;  Location: [facility];  Service: General;  Laterality: N/A;  . VASECTOMY      There were no vitals filed for this visit.    Subjective Assessment - 08/24/19 1220    Subjective Pain with gripping, using Rt Arm for pulling, holding items.  Pain began 3-4  months ago.  Does not recall specific injury.  Has since stopped using dumbbells but had begun to lift wgts about 7 mos.    Limitations Lifting;Writing;House hold activities;Other (comment)   work   Diagnostic tests msk [place] neg    Patient Stated Goals Patient would like to be able to exercise again without pain    Currently in Pain? Yes    Pain Score 1     Pain Location Elbow    Pain Orientation Right;Anterior    Pain Descriptors / Indicators Aching;Sharp;Dull   beginning to be a more dull aching pain   Pain Type Chronic pain    Pain Radiating Towards to back of elbow    Pain Onset More than a month ago    Pain Frequency Intermittent    Aggravating  Factors  using it    Pain Relieving Factors rest, voltaren gel a bit              [facility] PT Assessment - 08/24/19 0001      Assessment   Medical Diagnosis R elbow pain     Referring Provider (PT) Dr. 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, IT     Leisure play with my kids x 4:  16 and under       Cognition   Overall Cognitive Status Within Functional Limits for tasks assessed      Observation/Other Assessments   Focus on Therapeutic  Outcomes (FOTO)  46%      Circumferential Edema   Circumferential - Right 10 3/4 inch     Circumferential - Left  10 3/4 inch       Sensation   Light Touch Appears Intact      Posture/Postural Control   Posture Comments somewhat guarded in Rt UE       AROM   Right Elbow Flexion 144    Right Elbow Extension 3    Left Elbow Flexion 145    Left Elbow Extension 0      PROM   Overall PROM Comments WNL , discomfort end range elbow ext       Strength   Right Shoulder Flexion 4+/5    Right Elbow Flexion 5/5    Right Elbow Extension 5/5    Right Forearm Pronation 4+/5    Right Forearm Supination 4-/5   pain   Right Wrist Extension 4/5   pain     Palpation   Palpation comment painful in anterolateral cubital fossa and some along extensor compartment               Objective measurements completed on examination: See above findings.       [facility] Adult PT Treatment/Exercise - 08/24/19 0001      Self-Care   Heat/Ice Application ice, ionto     Other Self-Care Comments  HEP, POC       Elbow Exercises    Forearm Supination Strengthening;Right;10 reps    Bar Weights/Barbell (Forearm Supination) 3 lbs    Forearm Pronation Strengthening;Right;10 reps    Bar Weights/Barbell (Forearm Pronation) 3 lbs      Wrist Exercises   Wrist Flexion Self ROM;Right;5 reps    Wrist Extension Self ROM;Right;5 reps      Cryotherapy   Number Minutes Cryotherapy 6 Minutes    Cryotherapy Location Forearm    Type of Cryotherapy Ice pack      Iontophoresis   Type of Iontophoresis Dexamethasone    Location R anterior cubital fossa     Dose 1 cc     Time 6 hr patch       Manual Therapy   Soft tissue mobilization along flexor  and extensor tendon with focus on extensor compartment , rolling and stripping with light wrist flexion                   PT Education - 08/24/19 1318    Education Details PT/POC, tendinopathy, HEP, ionto    Person(s) Educated Patient    Methods Explanation;Demonstration;Handout    Comprehension Verbalized understanding;Returned demonstration               PT Long Term Goals - 08/24/19 1319      PT LONG TERM GOAL #1   Title Pt will improve FOTO score to <30% to show improved functional use of dominant arm.    Time 6    Period Weeks    Status New    Target Date 10/19/19      PT LONG TERM GOAL #2   Title Pt will be able to improve Rt grip to within 10 lbs of Lt.    Baseline Rt 69 lbs, Lt. 36 lbs    Time 6    Period Weeks    Status New    Target Date 10/19/19      PT LONG TERM GOAL #3   Title Pt will be able to return to lighter weight routine without > min  increase in Rt elbow pain in order to maintain health.    Time 6    Period Weeks    Status New    Target Date 10/19/19      PT LONG TERM GOAL #4   Title Pt will be able to lift his 44 yr old with bilateral UEs and no increase in Rt UE pain    Time 6    Period Weeks    Status New    Target Date 10/19/19                  Plan - 08/24/19 1324    Clinical Impression Statement Patient  presents for low complexity eval of Rt elbow pain with signs and symptoms consistent with tendinopathy of extensors , some supinator involvement. He has a drastic difference in grip strength (42% less on Rt) with pain.  He has moderate difficulty with opening items, carrying items depending on size. He was given treatment today and should do well with 8-12 visits over 6-8 weeks.    Personal Factors and Comorbidities Time since onset of injury/illness/exacerbation    Examination-Activity Limitations Lift;Carry    Examination-Participation Restrictions Other;Interpersonal Relationship;Community Activity   work   Stability/Clinical Decision Making Stable/Uncomplicated    Clinical Decision Making Low    Rehab Potential Excellent    PT Frequency 2x / week    PT Duration 8 weeks    PT Treatment/Interventions ADLs/Self Care Home Management;Iontophoresis 4mg /ml Dexamethasone;Therapeutic activities;Patient/family education;Therapeutic exercise;Cryotherapy;Ultrasound;Functional mobility training;Moist Heat;Dry needling;Manual techniques;Passive range of motion;Taping    PT Next Visit Plan manual, ionto? how was patch, HEP, gripping    PT Home Exercise Plan pronation/supination 1-3 lbs, stretch wrist flex/ext    Consulted and Agree with Plan of Care Patient           Patient will benefit from skilled therapeutic intervention in order to improve the following deficits and impairments:  Increased fascial restricitons, Pain, Decreased mobility, Decreased strength, Impaired flexibility, Impaired UE functional use  Visit Diagnosis: Pain in right elbow  Muscle weakness (generalized)     Problem List Patient Active Problem List   Diagnosis Date Noted  . Type II diabetes mellitus with manifestations (HCC) 10/14/2018  . Vitamin D deficiency disease 10/14/2018  . Essential hypertension 10/13/2018  . Hypothyroidism 03/18/2011  . Mixed hyperlipidemia 03/18/2011  . Fatty liver disease, nonalcoholic  03/18/2011  . Routine general medical examination at a health care facility 03/11/2011    Travis Sawyer 08/24/2019, 1:48 PM  [facility] [address] [place], [place], [place] Phone: [phone]   Fax:  [phone]  Name: Travis Sawyer MRN: [name] Date of Birth: 25-Aug-1975   12/14/1975, PT 08/24/19 1:49 PM Phone: [phone] Fax: [phone]

## 2019-08-30 ENCOUNTER — Other Ambulatory Visit: Payer: Self-pay

## 2019-08-30 ENCOUNTER — Ambulatory Visit: Payer: Managed Care, Other (non HMO) | Admitting: Physical Therapy

## 2019-08-30 ENCOUNTER — Encounter: Payer: Self-pay | Admitting: Physical Therapy

## 2019-08-30 DIAGNOSIS — M25521 Pain in right elbow: Secondary | ICD-10-CM | POA: Diagnosis not present

## 2019-08-30 DIAGNOSIS — M6281 Muscle weakness (generalized): Secondary | ICD-10-CM

## 2019-08-30 NOTE — Therapy (Signed)
[facility] [address] [place], [place], [address] Phone: [phone]   Fax:  [phone]  Physical Therapy Treatment  Patient Details  Name: Travis Sawyer MRN: [id number] Date of Birth: [birth year] Referring Provider (PT): Travis Sawyer   Encounter Date: 08/30/2019   PT End of Session - 08/30/19 1212    Visit Number 2    Number of Visits 12    Date for PT Re-Evaluation 10/19/19    Authorization Type [employer]    PT Start Time 0919    PT Stop Time 1000    PT Time Calculation (min) 41 min    Activity Tolerance Patient tolerated treatment well    Behavior During Therapy [facility] for tasks assessed/performed           Past Medical History:  Diagnosis Date  . Hyperlipidemia   . Hypertension   . Hypothyroidism   . Thyroid disease     Past Surgical History:  Procedure Laterality Date  . JOINT REPLACEMENT     knee sx  . LAPAROSCOPIC APPENDECTOMY N/A 11/03/2017   Procedure: APPENDECTOMY LAPAROSCOPIC;  Surgeon: Travis Lint, MD;  Location: [facility];  Service: General;  Laterality: N/A;  . VASECTOMY      There were no vitals filed for this visit.   Subjective Assessment - 08/30/19 0921    Subjective No changes, 1/10 pain .    Currently in Pain? Yes    Pain Score 1     Pain Location Elbow    Pain Orientation Right;Anterior;Posterior    Pain Descriptors / Indicators Aching;Dull    Pain Type Chronic pain    Pain Onset More than a month ago    Pain Frequency Intermittent    Aggravating Factors  gripping    Pain Relieving Factors rest    Multiple Pain Sites No              [facility] Adult PT Treatment/Exercise - 08/30/19 0001      Elbow Exercises   Forearm Supination Strengthening;Right;10 reps    Bar Weights/Barbell (Forearm Supination) 3 lbs    Forearm Pronation Strengthening;Right;10 reps    Bar Weights/Barbell (Forearm Pronation) 3 lbs      Hand Exercises   Other Hand Exercises velcro key grip x 30 sec and large  roller 2 sets x 6 passes       Wrist Exercises   Wrist Flexion Strengthening;Right;20 reps    Bar Weights/Barbell (Wrist Flexion) 3 lbs    Wrist Extension Strengthening;Right;20 reps    Bar Weights/Barbell (Wrist Extension) 3 lbs    Other wrist exercises forearm/wrist stretching done between sets       Cryotherapy   Cryotherapy Location Forearm ice massage until numb (cold, aching, burning then numb) about 5 min?     Iontophoresis   Type of Iontophoresis Dexamethasone    Location R anterior cubital fossa     Dose 1 cc     Time 6 hr patch       Manual Therapy   Manual therapy comments IASTm, more painful along extensors     Soft tissue mobilization along flexor and extensor tendon with focus on extensor compartment , rolling and stripping with light wrist flexion                   PT Education - 08/30/19 1211    Education Details ice massage    Person(s) Educated Patient    Methods Explanation;Demonstration    Comprehension Verbalized understanding  PT Long Term Goals - 08/24/19 1319      PT LONG TERM GOAL #1   Title Pt will improve FOTO score to <30% to show improved functional use of dominant arm.    Time 6    Period Weeks    Status New    Target Date 10/19/19      PT LONG TERM GOAL #2   Title Pt will be able to improve Rt grip to within 10 lbs of Lt.    Baseline Rt 69 lbs, Lt. 36 lbs    Time 6    Period Weeks    Status New    Target Date 10/19/19      PT LONG TERM GOAL #3   Title Pt will be able to return to lighter weight routine without > min increase in Rt elbow pain in order to maintain health.    Time 6    Period Weeks    Status New    Target Date 10/19/19      PT LONG TERM GOAL #4   Title Pt will be able to lift his 44 yr old with bilateral UEs and no increase in Rt UE pain    Time 6    Period Weeks    Status New    Target Date 10/19/19                 Plan - 08/30/19 0938    Clinical Impression Statement  Patietn here first treatment, reporting no change.  Cont to have pain with gripping and with turning key.  Addressed pain in extensors with manual and ionto patch.    PT Treatment/Interventions ADLs/Self Care Home Management;Iontophoresis 4mg /ml Dexamethasone;Therapeutic activities;Patient/family education;Therapeutic exercise;Cryotherapy;Ultrasound;Functional mobility training;Moist Heat;Dry needling;Manual techniques;Passive range of motion;Taping    PT Next Visit Plan manual, ionto, HEP, gripping    PT Home Exercise Plan pronation/supination 1-3 lbs, stretch wrist flex/ext, wrist flex and ext           Patient will benefit from skilled therapeutic intervention in order to improve the following deficits and impairments:  Increased fascial restricitons, Pain, Decreased mobility, Decreased strength, Impaired flexibility, Impaired UE functional use  Visit Diagnosis: Pain in right elbow  Muscle weakness (generalized)     Problem List Patient Active Problem List   Diagnosis Date Noted  . Type II diabetes mellitus with manifestations (HCC) 10/14/2018  . Vitamin D deficiency disease 10/14/2018  . Essential hypertension 10/13/2018  . Hypothyroidism 03/18/2011  . Mixed hyperlipidemia 03/18/2011  . Fatty liver disease, nonalcoholic 03/18/2011  . Routine general medical examination at a health care facility 03/11/2011    Travis Sawyer 08/30/2019, 12:17 PM  [facility] [address] [place], [place], [place] Phone: [phone]   Fax:  [phone]  Name: Travis Sawyer MRN: Travis Sawyer Date of Birth: [birth year]  12/14/1975, PT 08/30/19 12:18 PM Phone: [phone] Fax: [phone]

## 2019-08-30 NOTE — Patient Instructions (Signed)
Access Code: [email or web address]: [email or web address]: 07/19/2021Prepared by: Victorino Dike PaaExercises  Forearm Supination with Dumbbell - 2 x daily - 7 x weekly - 2 sets - 10 reps - 3 hold  Forearm Pronation with Dumbbell - 2 x daily - 7 x weekly - 2 sets - 10 reps - 3 hold  Seated Wrist Extension Stretch - 2 x daily - 7 x weekly - 1 sets - 3 reps - 30 hold  Seated Wrist Flexion Stretch - 2 x daily - 7 x weekly - 1 sets - 3 reps - 30 hold  Wrist Extension with Resistance - 2 x daily - 7 x weekly - 2 sets - 20 reps - 3 hold

## 2019-09-01 ENCOUNTER — Encounter: Payer: Self-pay | Admitting: Physical Therapy

## 2019-09-01 ENCOUNTER — Other Ambulatory Visit: Payer: Self-pay

## 2019-09-01 ENCOUNTER — Ambulatory Visit: Payer: Managed Care, Other (non HMO) | Admitting: Physical Therapy

## 2019-09-01 DIAGNOSIS — M25521 Pain in right elbow: Secondary | ICD-10-CM | POA: Diagnosis not present

## 2019-09-01 DIAGNOSIS — M6281 Muscle weakness (generalized): Secondary | ICD-10-CM

## 2019-09-01 NOTE — Therapy (Signed)
[facility] [address] [place], [place], [address] Phone: [phone]   Fax:  [phone]  Physical Therapy Treatment  Patient Details  Name: Travis Sawyer MRN: [id number] Date of Birth: 03/18/1975 Referring Provider (PT): Dr. [name]   Encounter Date: 09/01/2019   PT End of Session - 09/01/19 1501    Visit Number 3    Number of Visits 12    Date for PT Re-Evaluation 10/19/19    Authorization Type [employer]    PT Start Time 1050    PT Stop Time 1130    PT Time Calculation (min) 40 min    Activity Tolerance Patient tolerated treatment well    Behavior During Therapy [facility] for tasks assessed/performed           Past Medical History:  Diagnosis Date  . Hyperlipidemia   . Hypertension   . Hypothyroidism   . Thyroid disease     Past Surgical History:  Procedure Laterality Date  . JOINT REPLACEMENT     knee sx  . LAPAROSCOPIC APPENDECTOMY N/A 11/03/2017   Procedure: APPENDECTOMY LAPAROSCOPIC;  Surgeon: Almond Lint, MD;  Location: [facility];  Service: General;  Laterality: N/A;  . VASECTOMY      There were no vitals filed for this visit.   Subjective Assessment - 09/01/19 1054    Subjective Yesterday, was worse, 3/10 it was more constant.  Back to where it usually is.    Currently in Pain? Yes    Pain Score 1               [facility] Adult PT Treatment/Exercise - 09/01/19 0001      Elbow Exercises   Forearm Supination Strengthening;Right;10 reps    Bar Weights/Barbell (Forearm Supination) 2 lbs    Forearm Pronation Strengthening;Right;10 reps    Bar Weights/Barbell (Forearm Pronation) 2 lbs      Wrist Exercises   Wrist Flexion Strengthening;Right;20 reps    Bar Weights/Barbell (Wrist Flexion) 2 lbs    Wrist Extension Strengthening;Right;20 reps    Bar Weights/Barbell (Wrist Extension) 2 lbs    Other wrist exercises forearm/wrist stretching done between sets       Cryotherapy   Number Minutes Cryotherapy 8  Minutes    Cryotherapy Location Knee    Type of Cryotherapy Ice pack      Ultrasound   Ultrasound Location L elbow     Ultrasound Parameters 50%, 1.2 W/cm2  1 MHz    Ultrasound Goals Pain      Iontophoresis   Type of Iontophoresis Dexamethasone    Location R anterior cubital fossa     Dose 1 cc     Time 6 hr patch       Manual Therapy   Manual therapy comments IASTm, more painful along extensors     Soft tissue mobilization along flexor and extensor tendon with focus on extensor compartment , rolling and stripping with light wrist flexion                   PT Education - 09/01/19 1501    Education Details Ultrasound    Person(s) Educated Patient    Methods Explanation    Comprehension Verbalized understanding               PT Long Term Goals - 08/24/19 1319      PT LONG TERM GOAL #1   Title Pt will improve FOTO score to <30% to show improved functional use of dominant arm.  Time 6    Period Weeks    Status New    Target Date 10/19/19      PT LONG TERM GOAL #2   Title Pt will be able to improve Rt grip to within 10 lbs of Lt.    Baseline Rt 69 lbs, Lt. 36 lbs    Time 6    Period Weeks    Status New    Target Date 10/19/19      PT LONG TERM GOAL #3   Title Pt will be able to return to lighter weight routine without > min increase in Rt elbow pain in order to maintain health.    Time 6    Period Weeks    Status New    Target Date 10/19/19      PT LONG TERM GOAL #4   Title Pt will be able to lift his 44 yr old with bilateral UEs and no increase in Rt UE pain    Time 6    Period Weeks    Status New    Target Date 10/19/19                 Plan - 09/01/19 1502    Clinical Impression Statement Patient with increased pain yesterday, modified with lighter weight today and added ultrasound to reduce inflammation. Pain today with gripping, UBE, and loaded wrist flex/ext.    PT Treatment/Interventions ADLs/Self Care Home Management;Iontophoresis  4mg /ml Dexamethasone;Therapeutic activities;Patient/family education;Therapeutic exercise;Cryotherapy;Ultrasound;Functional mobility training;Moist Heat;Dry needling;Manual techniques;Passive range of motion;Taping    PT Next Visit Plan manual, ionto, HEP, gripping    PT Home Exercise Plan pronation/supination 1-3 lbs, stretch wrist flex/ext, wrist flex and ext    Consulted and Agree with Plan of Care Patient           Patient will benefit from skilled therapeutic intervention in order to improve the following deficits and impairments:  Increased fascial restricitons, Pain, Decreased mobility, Decreased strength, Impaired flexibility, Impaired UE functional use  Visit Diagnosis: Pain in right elbow  Muscle weakness (generalized)     Problem List Patient Active Problem List   Diagnosis Date Noted  . Type II diabetes mellitus with manifestations (HCC) 10/14/2018  . Vitamin D deficiency disease 10/14/2018  . Essential hypertension 10/13/2018  . Hypothyroidism 03/18/2011  . Mixed hyperlipidemia 03/18/2011  . Fatty liver disease, nonalcoholic 03/18/2011  . Routine general medical examination at a health care facility 03/11/2011    Garyn Arlotta 09/01/2019, 3:06 PM  [facility] [address] [place], [place], [place] Phone: [phone]   Fax:  [phone]  Name: Mansur Patti MRN: [name] Date of Birth: 1976/01/21  12/14/1975, PT 09/01/19 3:07 PM Phone: [phone] Fax: [phone]

## 2019-09-06 ENCOUNTER — Encounter: Payer: Self-pay | Admitting: Physical Therapy

## 2019-09-06 ENCOUNTER — Ambulatory Visit: Payer: Managed Care, Other (non HMO) | Admitting: Physical Therapy

## 2019-09-06 ENCOUNTER — Other Ambulatory Visit: Payer: Self-pay

## 2019-09-06 DIAGNOSIS — M25521 Pain in right elbow: Secondary | ICD-10-CM | POA: Diagnosis not present

## 2019-09-06 DIAGNOSIS — M6281 Muscle weakness (generalized): Secondary | ICD-10-CM

## 2019-09-06 NOTE — Therapy (Signed)
[facility] [address] [place], [place], [address] Phone: [phone]   Fax:  [phone]  Physical Therapy Treatment  Patient Details  Name: Travis Sawyer MRN: [id number] Date of Birth: [birth year] Referring Provider (PT): Dr. [name]   Encounter Date: 09/06/2019   PT End of Session - 09/06/19 1315    Visit Number 4    Number of Visits 12    Date for PT Re-Evaluation 10/19/19    Authorization Type [employer]    PT Start Time 1313    PT Stop Time 1352    PT Time Calculation (min) 39 min           Past Medical History:  Diagnosis Date   Hyperlipidemia    Hypertension    Hypothyroidism    Thyroid disease     Past Surgical History:  Procedure Laterality Date   JOINT REPLACEMENT     knee sx   LAPAROSCOPIC APPENDECTOMY N/A 11/03/2017   Procedure: APPENDECTOMY LAPAROSCOPIC;  Surgeon: Almond Lint, MD;  Location: [facility];  Service: General;  Laterality: N/A;   VASECTOMY      There were no vitals filed for this visit.   Subjective Assessment - 09/06/19 1316    Subjective 2/10 pain with certain movements.    Currently in Pain? Yes    Pain Score 2     Pain Location Elbow    Pain Orientation Right;Anterior;Posterior    Pain Descriptors / Indicators Aching;Dull    Pain Type Chronic pain                             [facility] Adult PT Treatment/Exercise - 09/06/19 0001      Elbow Exercises   Forearm Supination Strengthening;Right;10 reps   2 sets   Bar Weights/Barbell (Forearm Supination) 2 lbs    Forearm Pronation Strengthening;Right;10 reps   2 sets   Bar Weights/Barbell (Forearm Pronation) 2 lbs      Hand Exercises   Other Hand Exercises towel squeeze 5sec x 20 , Digi grip x 10        Wrist Exercises   Wrist Flexion Strengthening;Right;20 reps    Bar Weights/Barbell (Wrist Flexion) 3 lbs    Wrist Extension Strengthening;Right;20 reps    Bar Weights/Barbell (Wrist Extension) 3 lbs    Other  wrist exercises forearm/wrist stretching done between sets    supination/ pronation stretching      Ultrasound   Ultrasound Location L elbow    Ultrasound Parameters 50% 1.2 w/cm2     Ultrasound Goals Pain      Iontophoresis   Type of Iontophoresis Dexamethasone    Location R anterior cubital fossa     Dose 1 cc     Time 6 hr patch       Manual Therapy   Soft tissue mobilization along flexor and extensor tendon with focus on extensor compartment , rolling and stripping with light wrist flexion                        PT Long Term Goals - 08/24/19 1319      PT LONG TERM GOAL #1   Title Pt will improve FOTO score to <30% to show improved functional use of dominant arm.    Time 6    Period Weeks    Status New    Target Date 10/19/19      PT LONG TERM GOAL #2  Title Pt will be able to improve Rt grip to within 10 lbs of Lt.    Baseline Rt 69 lbs, Lt. 36 lbs    Time 6    Period Weeks    Status New    Target Date 10/19/19      PT LONG TERM GOAL #3   Title Pt will be able to return to lighter weight routine without > min increase in Rt elbow pain in order to maintain health.    Time 6    Period Weeks    Status New    Target Date 10/19/19      PT LONG TERM GOAL #4   Title Pt will be able to lift his 44 yr old with bilateral UEs and no increase in Rt UE pain    Time 6    Period Weeks    Status New    Target Date 10/19/19                 Plan - 09/06/19 1359    Clinical Impression Statement Pt reports he is about the same with 2/10 pain with certain movements.Began towel gripping. Cues given to not increase pain. He is unsure of the benefit of ionto/ ultraousnd treatments thus far. Repeated modalities to decrease inflammation and performed soft tissue work to decrease muscle tension.    PT Next Visit Plan manual, ionto, HEP, gripping    PT Home Exercise Plan pronation/supination 1-3 lbs, stretch wrist flex/ext, wrist flex and ext, verbally added towel  squeeze and supination/pronation self stretching.           Patient will benefit from skilled therapeutic intervention in order to improve the following deficits and impairments:  Increased fascial restricitons, Pain, Decreased mobility, Decreased strength, Impaired flexibility, Impaired UE functional use  Visit Diagnosis: Pain in right elbow  Muscle weakness (generalized)     Problem List Patient Active Problem List   Diagnosis Date Noted   Type II diabetes mellitus with manifestations (HCC) 10/14/2018   Vitamin D deficiency disease 10/14/2018   Essential hypertension 10/13/2018   Hypothyroidism 03/18/2011   Mixed hyperlipidemia 03/18/2011   Fatty liver disease, nonalcoholic 03/18/2011   Routine general medical examination at a health care facility 03/11/2011    Sherrie Mustache, PTA 09/06/2019, 2:03 PM  [facility] [address] [place], [place], [address] Phone: [phone]   Fax:  [phone]  Name: Travis Sawyer MRN: [id number] Date of Birth: 01-17-1976

## 2019-09-08 ENCOUNTER — Other Ambulatory Visit: Payer: Self-pay

## 2019-09-08 ENCOUNTER — Encounter: Payer: Self-pay | Admitting: Physical Therapy

## 2019-09-08 ENCOUNTER — Ambulatory Visit: Payer: Managed Care, Other (non HMO) | Admitting: Physical Therapy

## 2019-09-08 DIAGNOSIS — M6281 Muscle weakness (generalized): Secondary | ICD-10-CM

## 2019-09-08 DIAGNOSIS — M25521 Pain in right elbow: Secondary | ICD-10-CM

## 2019-09-08 NOTE — Therapy (Signed)
[facility] Outpatient Rehabilitation [facility] [address] [place], [place], [address] Phone: [phone]   Fax:  [phone]  Physical Therapy Treatment  Patient Details  Name: Ebrima Ranta MRN: [id number] Date of Birth: 03/29/1975 Referring Provider (PT): Dr. [name]   Encounter Date: 09/08/2019   PT End of Session - 09/08/19 1434    Visit Number 5    Number of Visits 12    Date for PT Re-Evaluation 10/19/19    Authorization Type [employer]    PT Start Time 1418    PT Stop Time 1458    PT Time Calculation (min) 40 min    Activity Tolerance Patient tolerated treatment well    Behavior During Therapy [facility] for tasks assessed/performed           Past Medical History:  Diagnosis Date  . Hyperlipidemia   . Hypertension   . Hypothyroidism   . Thyroid disease     Past Surgical History:  Procedure Laterality Date  . JOINT REPLACEMENT     knee sx  . LAPAROSCOPIC APPENDECTOMY N/A 11/03/2017   Procedure: APPENDECTOMY LAPAROSCOPIC;  Surgeon: Almond Lint, MD;  Location: [facility];  Service: General;  Laterality: N/A;  . VASECTOMY      There were no vitals filed for this visit.   Subjective Assessment - 09/08/19 1421    Subjective " I am still having pain with gripping and lifting, no changes in the pain."    Currently in Pain? Yes    Pain Score 2     Pain Orientation Right              [facility] PT Assessment - 09/08/19 0001      Assessment   Medical Diagnosis R elbow pain     Referring Provider (PT) Dr. [name]                         [facility] Adult PT Treatment/Exercise - 09/08/19 0001      Wrist Exercises   Wrist Extension Strengthening;Right;20 reps    Bar Weights/Barbell (Wrist Extension) Other (comment)   gripping red weighted ball,    Other wrist exercises forearm/wrist stretching 2 x 30 sec      Manual Therapy   Manual Therapy Joint mobilization;Other (comment)    Joint Mobilization lateral humerulnar joint mobs grade III  with belt and pt active gripping red weighted ball    Soft tissue mobilization IASTM along common extensors     Other Manual Therapy using pre-wrap to make tennis elbow strap            Trigger Point Dry Needling - 09/08/19 0001    Consent Given? Yes    Education Handout Provided Yes    Muscles Treated Upper Quadrant Supinator    Muscles Treated Wrist/Hand Extensor carpi radialis longus/brevis    Electrical Stimulation Performed with Dry Needling Yes    E-stim with Dry Needling Details frequency at 18, intensity set to tolerance and incfeased halfway x 8 min     Supinator Response Twitch response elicited;Palpable increased muscle length   R   Extensor carpi radialis longus/brevis Response Twitch response elicited;Palpable increased muscle length   R               PT Education - 09/08/19 1433    Education Details muscle anatomy and referral patterns, What TPDN is, what to expect and benefits.    Person(s) Educated Patient    Methods Explanation;Handout;Verbal cues  Comprehension Verbalized understanding;Verbal cues required               PT Long Term Goals - 08/24/19 1319      PT LONG TERM GOAL #1   Title Pt will improve FOTO score to <30% to show improved functional use of dominant arm.    Time 6    Period Weeks    Status New    Target Date 10/19/19      PT LONG TERM GOAL #2   Title Pt will be able to improve Rt grip to within 10 lbs of Lt.    Baseline Rt 69 lbs, Lt. 36 lbs    Time 6    Period Weeks    Status New    Target Date 10/19/19      PT LONG TERM GOAL #3   Title Pt will be able to return to lighter weight routine without > min increase in Rt elbow pain in order to maintain health.    Time 6    Period Weeks    Status New    Target Date 10/19/19      PT LONG TERM GOAL #4   Title Pt will be able to lift his 44 yr old with bilateral UEs and no increase in Rt UE pain    Time 6    Period Weeks    Status New    Target Date 10/19/19                  Plan - 09/08/19 1544    Clinical Impression Statement pt reports consistency with his HEP notes limited improvement since the previous session reporting 2/10 pain. edcuated and consent was provided for TPDN for the common extensors and supinator combined with E-stim followed with IASTM Techniques. continued extensor strengtheing with gripping focusing on eccentric loading. Applied pre-warp extensor strap which he reported decreased soreness. Held off on ionto due to pt having it the last 4 session.    PT Treatment/Interventions ADLs/Self Care Home Management;Iontophoresis 4mg /ml Dexamethasone;Therapeutic activities;Patient/family education;Therapeutic exercise;Cryotherapy;Ultrasound;Functional mobility training;Moist Heat;Dry needling;Manual techniques;Passive range of motion;Taping    PT Next Visit Plan responsed to TPDN, elbow MWM, IASTM, wrist extensor    PT Home Exercise Plan pronation/supination 1-3 lbs, stretch wrist flex/ext, wrist flex and ext, verbally added towel squeeze and supination/pronation self stretching.    Consulted and Agree with Plan of Care Patient           Patient will benefit from skilled therapeutic intervention in order to improve the following deficits and impairments:  Increased fascial restricitons, Pain, Decreased mobility, Decreased strength, Impaired flexibility, Impaired UE functional use  Visit Diagnosis: Pain in right elbow  Muscle weakness (generalized)     Problem List Patient Active Problem List   Diagnosis Date Noted  . Type II diabetes mellitus with manifestations (HCC) 10/14/2018  . Vitamin D deficiency disease 10/14/2018  . Essential hypertension 10/13/2018  . Hypothyroidism 03/18/2011  . Mixed hyperlipidemia 03/18/2011  . Fatty liver disease, nonalcoholic 03/18/2011  . Routine general medical examination at a health care facility 03/11/2011    03/13/2011 PT, DPT, LAT, ATC  09/08/19  3:52 PM      [facility]  Health Outpatient Rehabilitation [facility] [address] [place], [place], [place] Phone: [phone]   Fax:  [phone]  Name: Hershall Benkert MRN: [name] Date of Birth: [birth year]

## 2019-09-15 ENCOUNTER — Encounter: Payer: Managed Care, Other (non HMO) | Admitting: Physical Therapy

## 2019-09-17 ENCOUNTER — Encounter: Payer: Managed Care, Other (non HMO) | Admitting: Physical Therapy

## 2019-09-17 ENCOUNTER — Other Ambulatory Visit: Payer: Self-pay | Admitting: Internal Medicine

## 2019-09-17 ENCOUNTER — Ambulatory Visit: Payer: Managed Care, Other (non HMO) | Admitting: Physical Therapy

## 2019-09-17 DIAGNOSIS — E039 Hypothyroidism, unspecified: Secondary | ICD-10-CM

## 2019-09-20 ENCOUNTER — Ambulatory Visit: Payer: Managed Care, Other (non HMO) | Attending: Family Medicine | Admitting: Physical Therapy

## 2019-09-20 ENCOUNTER — Encounter: Payer: Self-pay | Admitting: Physical Therapy

## 2019-09-20 ENCOUNTER — Other Ambulatory Visit: Payer: Self-pay

## 2019-09-20 DIAGNOSIS — M6281 Muscle weakness (generalized): Secondary | ICD-10-CM

## 2019-09-20 DIAGNOSIS — M25521 Pain in right elbow: Secondary | ICD-10-CM

## 2019-09-20 NOTE — Therapy (Signed)
[facility] Outpatient Rehabilitation [facility] [address] [place], [place], [address] Phone: [phone]   Fax:  [phone]  Physical Therapy Treatment  Patient Details  Name: Travis Sawyer MRN: [id number] Date of Birth: [birth year] Referring Provider (PT): Dr. [name]   Encounter Date: 09/20/2019   PT End of Session - 09/20/19 0928    Visit Number 6    Number of Visits 12    Date for PT Re-Evaluation 10/19/19    Authorization Type [employer]    PT Start Time 0918    PT Stop Time 0958    PT Time Calculation (min) 40 min    Activity Tolerance Patient tolerated treatment well    Behavior During Therapy [facility] for tasks assessed/performed           Past Medical History:  Diagnosis Date  . Hyperlipidemia   . Hypertension   . Hypothyroidism   . Thyroid disease     Past Surgical History:  Procedure Laterality Date  . JOINT REPLACEMENT     knee sx  . LAPAROSCOPIC APPENDECTOMY N/A 11/03/2017   Procedure: APPENDECTOMY LAPAROSCOPIC;  Surgeon: Almond Lint, MD;  Location: [facility];  Service: General;  Laterality: N/A;  . VASECTOMY      There were no vitals filed for this visit.   Subjective Assessment - 09/20/19 0926    Subjective The dry needling helped for a few days.  I could grip better.  The morning is bad because I sleep on it.    Currently in Pain? Yes    Pain Score 2     Pain Location Elbow    Pain Orientation Right    Pain Descriptors / Indicators Aching;Sharp    Pain Type Chronic pain    Pain Radiating Towards back of elbow, forearm    Pain Onset More than a month ago    Pain Frequency Intermittent    Aggravating Factors  gripping, AM    Pain Relieving Factors rest, ice, dn    Effect of Pain on Daily Activities hard to work              [facility] Adult PT Treatment/Exercise - 09/20/19 0001      Elbow Exercises   Elbow Flexion Strengthening;Right;15 reps;Other (comment)    Theraband Level (Elbow Flexion) --   2 sets, hammer curl    Bar  Weights/Barbell (Elbow Flexion) 3 lbs      Shoulder Exercises: Standing   Flexion Strengthening;Right;20 reps    Shoulder Flexion Weight (lbs) 3   2 sets neutral grip      Shoulder Exercises: ROM/Strengthening   UBE (Upper Arm Bike) 5 min reduce Rt grip as needed for comfort       Wrist Exercises   Wrist Extension Strengthening;Right;20 reps    Bar Weights/Barbell (Wrist Extension) 2 lbs;3 lbs    Other wrist exercises forearm/wrist stretching 2 x 30 sec      Cryotherapy   Number Minutes Cryotherapy 8 Minutes    Cryotherapy Location Forearm    Type of Cryotherapy Ice pack      Manual Therapy   Soft tissue mobilization IASTM along common extensors, flexors      Other Manual Therapy manual Trigger point work , MWM                       PT Long Term Goals - 08/24/19 1319      PT LONG TERM GOAL #1   Title Pt will improve FOTO score  to <30% to show improved functional use of dominant arm.    Time 6    Period Weeks    Status New    Target Date 10/19/19      PT LONG TERM GOAL #2   Title Pt will be able to improve Rt grip to within 10 lbs of Lt.    Baseline Rt 69 lbs, Lt. 36 lbs    Time 6    Period Weeks    Status New    Target Date 10/19/19      PT LONG TERM GOAL #3   Title Pt will be able to return to lighter weight routine without > min increase in Rt elbow pain in order to maintain health.    Time 6    Period Weeks    Status New    Target Date 10/19/19      PT LONG TERM GOAL #4   Title Pt will be able to lift his 44 yr old with bilateral UEs and no increase in Rt UE pain    Time 6    Period Weeks    Status New    Target Date 10/19/19                 Plan - 09/20/19 0929    Clinical Impression Statement Patient with increased pain today with Rt UE strengthening exercises. He had relief of pain for about 2 days post DN but pain came back when he hit it on the edge of a chair. Pain in extensor compartment but flexors very tight as well. Spent  increased time on manual, mostly today with ice post.    PT Treatment/Interventions ADLs/Self Care Home Management;Iontophoresis 4mg /ml Dexamethasone;Therapeutic activities;Patient/family education;Therapeutic exercise;Cryotherapy;Ultrasound;Functional mobility training;Moist Heat;Dry needling;Manual techniques;Passive range of motion;Taping    PT Next Visit Plan FOTO! response to TPDN, elbow MWM, IASTM, wrist extensor. Sees MD Thursday    PT Home Exercise Plan pronation/supination 1-3 lbs, stretch wrist flex/ext, wrist flex and ext, verbally added towel squeeze and supination/pronation self stretching.    Consulted and Agree with Plan of Care Patient           Patient will benefit from skilled therapeutic intervention in order to improve the following deficits and impairments:  Increased fascial restricitons, Pain, Decreased mobility, Decreased strength, Impaired flexibility, Impaired UE functional use  Visit Diagnosis: Pain in right elbow  Muscle weakness (generalized)     Problem List Patient Active Problem List   Diagnosis Date Noted  . Type II diabetes mellitus with manifestations (HCC) 10/14/2018  . Vitamin D deficiency disease 10/14/2018  . Essential hypertension 10/13/2018  . Hypothyroidism 03/18/2011  . Mixed hyperlipidemia 03/18/2011  . Fatty liver disease, nonalcoholic 03/18/2011  . Routine general medical examination at a health care facility 03/11/2011    Ted Goodner 09/20/2019, 10:16 AM  [facility] [address] [place], [place], [place] Phone: [phone]   Fax:  [phone]  Name: Travis Sawyer MRN: [name] Date of Birth: [birth year]  12/14/1975, PT 09/20/19 10:16 AM Phone: [phone] Fax: [phone]

## 2019-09-22 ENCOUNTER — Ambulatory Visit: Payer: Managed Care, Other (non HMO) | Admitting: Physical Therapy

## 2019-09-22 ENCOUNTER — Other Ambulatory Visit: Payer: Self-pay

## 2019-09-22 DIAGNOSIS — M6281 Muscle weakness (generalized): Secondary | ICD-10-CM | POA: Diagnosis present

## 2019-09-22 DIAGNOSIS — M25521 Pain in right elbow: Secondary | ICD-10-CM

## 2019-09-22 NOTE — Therapy (Addendum)
[place], [place], [address] Phone: [phone]   Fax:  [phone]  Physical Therapy Treatment/Discharge  Patient Details  Name: Travis Sawyer MRN: [id number] Date of Birth: 02/12/1975 Referring Provider (PT): Dr. Lynne Leader   Encounter Date: 09/22/2019   PT End of Session - 09/22/19 0928    Visit Number 7    Number of Visits 12    Date for PT Re-Evaluation 10/19/19    Authorization Type [employer]    PT Start Time 0918    PT Stop Time 1003    PT Time Calculation (min) 45 min    Activity Tolerance Patient tolerated treatment well    Behavior During Therapy [facility] for tasks assessed/performed           Past Medical History:  Diagnosis Date  . Hyperlipidemia   . Hypertension   . Hypothyroidism   . Thyroid disease     Past Surgical History:  Procedure Laterality Date  . JOINT REPLACEMENT     knee sx  . LAPAROSCOPIC APPENDECTOMY N/A 11/03/2017   Procedure: APPENDECTOMY LAPAROSCOPIC;  Surgeon: Stark Klein, MD;  Location: [facility];  Service: General;  Laterality: N/A;  . VASECTOMY      There were no vitals filed for this visit.   Subjective Assessment - 09/22/19 0930    Subjective Its a 1/10.  Sees MD tomorrow.  Worse as the day goes on, 3/10 max.    Currently in Pain? Yes    Pain Score 1     Pain Location Elbow    Pain Orientation Right    Pain Descriptors / Indicators Aching    Pain Type Chronic pain    Pain Onset More than a month ago    Pain Frequency Intermittent    Aggravating Factors  gripping, sleeping on it    Pain Relieving Factors rest, ice, massage, DN              [facility] PT Assessment - 09/22/19 0001      Strength   Right Hand Grip (lbs) --   62 lbs   Left Hand Grip (lbs) --   70 lb           [facility] Adult PT Treatment/Exercise - 09/22/19 0001      Shoulder Exercises: Standing   Horizontal ABduction Strengthening;Both;10 reps;Theraband    Theraband Level (Shoulder  Horizontal ABduction) Level 3 (Green)    Horizontal ABduction Weight (lbs) 2 sets     External Rotation Strengthening;15 reps    Theraband Level (Shoulder External Rotation) Level 2 (Red)    Extension Strengthening;Both;20 reps    Theraband Level (Shoulder Extension) Level 3 (Green)    Extension Weight (lbs) 2 sets palms front and then back       Shoulder Exercises: ROM/Strengthening   Wall Pushups 10 reps    Wall Pushups Limitations 2 sets wide and then triceps       Wrist Exercises   Wrist Flexion Right;20 reps    Bar Weights/Barbell (Wrist Flexion) 3 lbs    Wrist Extension Strengthening;Right;20 reps    Bar Weights/Barbell (Wrist Extension) 3 lbs    Wrist Radial Deviation Right;20 reps    Bar Weights/Barbell (Radial Deviation) 2 lbs    Other wrist exercises forearm/wrist stretching 2 x 30 sec standing hands on table and open chain as well       Manual Therapy   Soft tissue mobilization IASTM along common extensors, flexors      Other  Manual Therapy manual Trigger point work , MWM   used biofreeze for cryo          velcro supination and pronation x 2 min         PT Education - 09/22/19 1333    Education Details neutral wrist    Person(s) Educated Patient    Methods Explanation    Comprehension Verbalized understanding               PT Long Term Goals - 08/24/19 1319      PT LONG TERM GOAL #1   Title Pt will improve FOTO score to <30% to show improved functional use of dominant arm.    Time 6    Period Weeks    Status New    Target Date 10/19/19      PT LONG TERM GOAL #2   Title Pt will be able to improve Rt grip to within 10 lbs of Lt.    Baseline Rt 69 lbs, Lt. 42 lbs    Time 6    Period Weeks    Status New    Target Date 10/19/19      PT LONG TERM GOAL #3   Title Pt will be able to return to lighter weight routine without > min increase in Rt elbow pain in order to maintain health.    Time 6    Period Weeks    Status New    Target Date  10/19/19      PT LONG TERM GOAL #4   Title Pt will be able to lift his 44 yr old with bilateral UEs and no increase in Rt UE pain    Time 6    Period Weeks    Status New    Target Date 10/19/19                 Plan - 09/22/19 1333    Clinical Impression Statement FOTO score improved 12% .  Cont to have weakness in R UE and pain with gripping, supination. He has to unexpectedly leave town and may reschedule when he returns and sees MD.    PT Treatment/Interventions ADLs/Self Care Home Management;Iontophoresis 61m/ml Dexamethasone;Therapeutic activities;Patient/family education;Therapeutic exercise;Cryotherapy;Ultrasound;Functional mobility training;Moist Heat;Dry needling;Manual techniques;Passive range of motion;Taping    PT Next Visit Plan update HEP, Cont strength grip and UE exercises    PT Home Exercise Plan pronation/supination 1-3 lbs, stretch wrist flex/ext, wrist flex and ext, verbally added towel squeeze and supination/pronation self stretching.    Consulted and Agree with Plan of Care Patient           Patient will benefit from skilled therapeutic intervention in order to improve the following deficits and impairments:  Increased fascial restricitons, Pain, Decreased mobility, Decreased strength, Impaired flexibility, Impaired UE functional use  Visit Diagnosis: Pain in right elbow  Muscle weakness (generalized)     Problem List Patient Active Problem List   Diagnosis Date Noted  . Type II diabetes mellitus with manifestations ([place] 10/14/[place]  . Vitamin D deficiency disease 10/14/2018  . Essential hypertension 10/13/2018  . Hypothyroidism 03/18/2011  . Mixed hyperlipidemia 03/18/2011  . Fatty liver disease, nonalcoholic 016/11/9602 . Routine general medical examination at a health care facility 03/11/2011    [name] 09/22/2019, 1:39 PM  [facility] [address]Phone:  [phone]  Fax:  [phone] Name: Travis ReussMRN: [id number] of Birth: [birth year] JRaeford Razor PT 09/22/19  1:40 PM Phone: [phone] Fax: [phone]   PHYSICAL THERAPY DISCHARGE SUMMARY  Visits from Start of Care: 7  Current functional level related to goals / functional outcomes: Unknown ,see above for most recent    Remaining deficits: Unknown   Education / Equipment: HEP, RICE  Plan: Patient agrees to discharge.  Patient goals were partially met. Patient is being discharged due to not returning since the last visit.  ?????    Raeford Razor, PT 11/18/19 11:25 AM Phone: [phone] Fax: [phone]

## 2019-09-23 ENCOUNTER — Ambulatory Visit: Payer: Self-pay

## 2019-09-23 ENCOUNTER — Ambulatory Visit: Payer: Managed Care, Other (non HMO) | Admitting: Family Medicine

## 2019-09-23 VITALS — BP 110/78 | HR 80 | Ht 68.0 in | Wt 212.0 lb

## 2019-09-23 DIAGNOSIS — M25521 Pain in right elbow: Secondary | ICD-10-CM

## 2019-09-23 DIAGNOSIS — M7711 Lateral epicondylitis, right elbow: Secondary | ICD-10-CM | POA: Diagnosis not present

## 2019-09-23 MED ORDER — NITROGLYCERIN 0.2 MG/HR TD PT24: MEDICATED_PATCH | TRANSDERMAL | 1 refills | Status: DC

## 2019-09-23 NOTE — Progress Notes (Signed)
   Wynema Birch, am serving as a Neurosurgeon for Dr. [name].  Travis Sawyer is a 44 y.o. male who presents to [facility] today for f/u of R elbow pain.  He was last seen by Dr. Denyse Amass on 08/12/19 and was referred to PT of which he has completed 7 visits.  He was advised to use Voltaren gel.  Since his last visit, pt reports that he is only feeling slightly better.  Pain is transitioned mostly to the lateral elbow now he did not have as much anterior elbow pain..  Diagnostic testing: R elbow XR- 08/12/19   Pertinent review of systems: No fevers or chills  Relevant historical information: Diabetes, hypothyroidism, hypertension   Exam:  BP 110/78 (BP Location: Left Arm, Patient Position: Sitting, Cuff Size: Normal)   Pulse 80   Ht 5\' 8"  (1.727 m)   Wt 212 lb (96.2 kg)   SpO2 98%   BMI 32.23 kg/m  General: Well Developed, well nourished, and in no acute distress.   MSK: Right elbow normal-appearing normal motion normal strength.  Tender palpation lateral epicondyle.  Pain with resisted wrist extension. Cervical spine normal-appearing nontender normal cervical motion negative Spurling's test.  Intact strength bilateral upper extremities.    Lab and Radiology Results Diagnostic Limited MSK Ultrasound of: Right elbow Other epicondyles visualized.  Small avulsion fragment/flattening of the superficial portion of the lateral epicondyle.  Increased vascular tibial Doppler at mid substance tendon consistent with partial split tear.  No large tear or retraction. Impression: Lateral epicondylitis with partial tear      Assessment and Plan: 44 y.o. male with right elbow pain.  Pain has transferred mostly to the lateral elbow and is now much more consistent with lateral epicondylitis.  Discussed options.  Plan to continue at home exercise program and limited physical therapy.  We will add nitroglycerin patch protocol which should be quite helpful.  If not  improving patient will notify me.  Next steps are either steroid injection or PRP injection or MRI.  We discussed these options as well.  He will let me know what he would like to do next.  Otherwise recheck 6 weeks.    Orders Placed This Encounter  Procedures  . 46 LIMITED JOINT SPACE STRUCTURES UP RIGHT    Standing Status:   Future    Number of Occurrences:   1    Standing Expiration Date:   09/22/2020    Order Specific Question:   Reason for Exam (SYMPTOM  OR DIAGNOSIS REQUIRED)    Answer:   Right elbow pain    Order Specific Question:   Preferred imaging location?    Answer:   [facility] ordered this encounter  Medications  . nitroGLYCERIN (NITRODUR - DOSED IN MG/24 HR) 0.2 mg/hr patch    Sig: Apply 1/4 patch daily to tendon for tendonitis.    Dispense:  30 patch    Refill:  1     Discussed warning signs or symptoms. Please see discharge instructions. Patient expresses understanding.   The above documentation has been reviewed and is accurate and complete [facility], [facility].

## 2019-09-23 NOTE — Patient Instructions (Signed)
Thank you for coming in today.  Add nitro-patches.  Try body helix hull elbow sleeve.  If not better next step is MRI vs cortisone vs PRP injection. IF needed.   Keep me updated.  Recheck in about 6 week.   Nitroglycerin Protocol   Apply 1/4 nitroglycerin patch to affected area daily.  Change position of patch within the affected area every 24 hours.  You may experience a headache during the first 1-2 weeks of using the patch, these should subside.  If you experience headaches after beginning nitroglycerin patch treatment, you may take your preferred over the counter pain reliever.  Another side effect of the nitroglycerin patch is skin irritation or rash related to patch adhesive.  Please notify our office if you develop more severe headaches or rash, and stop the patch.  Tendon healing with nitroglycerin patch may require 12 to 24 weeks depending on the extent of injury.  Men should not use if taking Viagra, Cialis, or Levitra.   Do not use if you have migraines or rosacea.    Tennis Elbow Tennis elbow is swelling (inflammation) in your outer forearm, near your elbow. Swelling affects the tissues that connect muscle to bone (tendons). Tennis elbow can happen in any sport or job in which you use your elbow too much. It is caused by doing the same motion over and over. Tennis elbow can cause:  Pain and tenderness in your forearm and the outer part of your elbow. You may have pain all the time, or only when using the arm.  A burning feeling. This runs from your elbow through your arm.  Weak grip in your hand. Follow these instructions at home: Activity  Rest your elbow and wrist. Avoid activities that cause problems, as told by your doctor.  If told by your doctor, wear an elbow strap to reduce stress on the area.  Do physical therapy exercises as told.  If you lift an object, lift it with your palm facing up. This is easier on your elbow. Lifestyle  If your tennis  elbow is caused by sports, check your equipment and make sure that: ? You are using it correctly. ? It fits you well.  If your tennis elbow is caused by work or by using a computer, take breaks often to stretch your arm. Talk with your manager about how you can manage your condition at work. If you have a brace:  Wear the brace as told by your doctor. Remove it only as told by your doctor.  Loosen the brace if your fingers tingle, get numb, or turn cold and blue.  Keep the brace clean.  If the brace is not waterproof, ask your doctor if you may take the brace off for bathing. If you must keep the brace on while bathing: ? Do not let it get wet. ? Cover it with a watertight covering when you take a bath or a shower. General instructions   If told, put ice on the painful area: ? Put ice in a plastic bag. ? Place a towel between your skin and the bag. ? Leave the ice on for 20 minutes, 2-3 times a day.  Take over-the-counter and prescription medicines only as told by your doctor.  Keep all follow-up visits as told by your doctor. This is important. Contact a doctor if:  Your pain does not get better with treatment.  Your pain gets worse.  You have weakness in your forearm, hand, or fingers.  You cannot  feel your forearm, hand, or fingers. Summary  Tennis elbow is swelling (inflammation) in your outer forearm, near your elbow.  Tennis elbow is caused by doing the same motion over and over.  Rest your elbow and wrist. Avoid activities that cause problems, as told by your doctor.  If told, put ice on the painful area for 20 minutes, 2-3 times a day. This information is not intended to replace advice given to you by your health care provider. Make sure you discuss any questions you have with your health care provider. Document Revised: 10/24/2017 Document Reviewed: 11/12/2016 Elsevier Patient Education  2020 [employer].

## 2019-11-04 ENCOUNTER — Ambulatory Visit: Payer: Managed Care, Other (non HMO) | Admitting: Family Medicine

## 2019-11-04 ENCOUNTER — Ambulatory Visit: Payer: Self-pay

## 2019-11-04 ENCOUNTER — Encounter: Payer: Self-pay | Admitting: Family Medicine

## 2019-11-04 ENCOUNTER — Other Ambulatory Visit: Payer: Self-pay

## 2019-11-04 VITALS — BP 104/76 | HR 85 | Ht 68.0 in | Wt 218.4 lb

## 2019-11-04 DIAGNOSIS — M7711 Lateral epicondylitis, right elbow: Secondary | ICD-10-CM

## 2019-11-04 MED ORDER — NITROGLYCERIN 0.2 MG/HR TD PT24: MEDICATED_PATCH | TRANSDERMAL | 3 refills | Status: DC

## 2019-11-04 NOTE — Patient Instructions (Signed)
Thank you for coming in today.  Continue home exercises and nitro patches.   Recheck if needed.    Can do injections if needed.

## 2019-11-04 NOTE — Progress Notes (Signed)
   I, Christoper Fabian, LAT, ATC, am serving as scribe for Dr. [name].  Travis Sawyer is a 44 y.o. male who presents to [facility] at [facility] today for f/u of R elbow pain.  He was last seen by Dr. Denyse Amass on 09/23/19 and was prescribed nitroglycerin patches and advised to use an elbow compression sleeve.  He completed a prior course of PT.  Since his last visit, pt reports that his R elbow is doing better and notes 60-65% improvement.  He notes con't pain w brushing his teeth, putting on his mask, catching a ball,etc.    Diagnostic imaging: R elbow XR- 08/12/19   Pertinent review of systems: No fevers or chills  Relevant historical information: Diabetes   Exam:  BP 104/76 (BP Location: Right Arm, Patient Position: Sitting, Cuff Size: Large)   Pulse 85   Ht 5\' 8"  (1.727 m)   Wt 218 lb 6.4 oz (99.1 kg)   SpO2 97%   BMI 33.21 kg/m  General: Well Developed, well nourished, and in no acute distress.   MSK: Right elbow normal-appearing tender palpation lateral epicondyle.  Normal elbow motion and strength.  Pain with resisted wrist extension.     Assessment and Plan: 44 y.o. male with right lateral epicondylitis.  Improving with home exercise program and nitroglycerin patch protocol but improving slowly.  About 60 to 70% better now.  Plan to continue current treatment and reassess as needed.  Discussed options and plan.  Next steps would be either steroid or PRP injection or MRI.   PDMP not reviewed this encounter. Orders Placed This Encounter  Procedures  . 45 LIMITED JOINT SPACE STRUCTURES UP RIGHT(NO LINKED CHARGES)    Order Specific Question:   Reason for Exam (SYMPTOM  OR DIAGNOSIS REQUIRED)    Answer:   R elbow pain    Order Specific Question:   Preferred imaging location?    Answer:   [facility] ordered this encounter  Medications  . nitroGLYCERIN (NITRODUR - DOSED IN MG/24 HR) 0.2 mg/hr patch    Sig: Apply 1/4 patch  daily to tendon for tendonitis.    Dispense:  30 patch    Refill:  3     Discussed warning signs or symptoms. Please see discharge instructions. Patient expresses understanding.   The above documentation has been reviewed and is accurate and complete [facility], M.D. Total encounter time 20 minutes including face-to-face time with the patient and, reviewing past medical record, and charting on the date of service.   Discussed plan as above

## 2019-12-16 ENCOUNTER — Other Ambulatory Visit: Payer: Self-pay | Admitting: Internal Medicine

## 2019-12-16 DIAGNOSIS — E039 Hypothyroidism, unspecified: Secondary | ICD-10-CM

## 2020-01-04 LAB — HM DIABETES EYE EXAM

## 2020-01-12 ENCOUNTER — Other Ambulatory Visit: Payer: Self-pay | Admitting: Internal Medicine

## 2020-01-12 DIAGNOSIS — E118 Type 2 diabetes mellitus with unspecified complications: Secondary | ICD-10-CM

## 2020-01-12 DIAGNOSIS — I10 Essential (primary) hypertension: Secondary | ICD-10-CM

## 2020-01-13 ENCOUNTER — Other Ambulatory Visit: Payer: Self-pay

## 2020-01-13 ENCOUNTER — Encounter: Payer: Self-pay | Admitting: Internal Medicine

## 2020-01-13 ENCOUNTER — Ambulatory Visit (INDEPENDENT_AMBULATORY_CARE_PROVIDER_SITE_OTHER): Payer: Managed Care, Other (non HMO) | Admitting: Internal Medicine

## 2020-01-13 VITALS — BP 126/84 | HR 75 | Temp 98.3°F | Ht 68.0 in | Wt 223.0 lb

## 2020-01-13 DIAGNOSIS — E039 Hypothyroidism, unspecified: Secondary | ICD-10-CM

## 2020-01-13 DIAGNOSIS — E782 Mixed hyperlipidemia: Secondary | ICD-10-CM | POA: Diagnosis not present

## 2020-01-13 DIAGNOSIS — I10 Essential (primary) hypertension: Secondary | ICD-10-CM

## 2020-01-13 DIAGNOSIS — E785 Hyperlipidemia, unspecified: Secondary | ICD-10-CM | POA: Diagnosis not present

## 2020-01-13 DIAGNOSIS — E118 Type 2 diabetes mellitus with unspecified complications: Secondary | ICD-10-CM

## 2020-01-13 DIAGNOSIS — Z23 Encounter for immunization: Secondary | ICD-10-CM | POA: Diagnosis not present

## 2020-01-13 LAB — HEPATIC FUNCTION PANEL
ALT: 20 U/L (ref 0–53)
AST: 17 U/L (ref 0–37)
Albumin: 4.3 g/dL (ref 3.5–5.2)
Alkaline Phosphatase: 45 U/L (ref 39–117)
Bilirubin, Direct: 0.1 mg/dL (ref 0.0–0.3)
Total Bilirubin: 0.5 mg/dL (ref 0.2–1.2)
Total Protein: 7.1 g/dL (ref 6.0–8.3)

## 2020-01-13 LAB — BASIC METABOLIC PANEL
BUN: 10 mg/dL (ref 6–23)
CO2: 30 mEq/L (ref 19–32)
Calcium: 9.1 mg/dL (ref 8.4–10.5)
Chloride: 101 mEq/L (ref 96–112)
Creatinine, Ser: 1.05 mg/dL (ref 0.40–1.50)
GFR: 87.71 mL/min (ref >60.00)
Glucose, Bld: 112 mg/dL — ABNORMAL HIGH (ref 70–99)
Potassium: 3.7 mEq/L (ref 3.5–5.1)
Sodium: 137 mEq/L (ref 135–145)

## 2020-01-13 LAB — LIPID PANEL
Cholesterol: 219 mg/dL — ABNORMAL HIGH (ref 0–200)
HDL: 31.7 mg/dL — ABNORMAL LOW (ref >39.00)
NonHDL: 187.09
Total CHOL/HDL Ratio: 7
Triglycerides: 270 mg/dL — ABNORMAL HIGH (ref 0.0–149.0)
VLDL: 54 mg/dL — ABNORMAL HIGH (ref 0.0–40.0)

## 2020-01-13 LAB — LDL CHOLESTEROL, DIRECT: Direct LDL: 150 mg/dL

## 2020-01-13 LAB — TSH: TSH: 1.59 u[IU]/mL (ref 0.35–4.50)

## 2020-01-13 LAB — HEMOGLOBIN A1C: Hgb A1c MFr Bld: 6.2 % (ref 4.6–6.5)

## 2020-01-13 MED ORDER — IRBESARTAN 150 MG PO TABS: 150.0000 mg | ORAL_TABLET | Freq: Every day | ORAL | 1 refills | Status: DC

## 2020-01-13 NOTE — Progress Notes (Signed)
Subjective:  Patient ID: Travis Sawyer, male    DOB: 05/09/1975  Age: 44 y.o. MRN: [id number]  CC: Hypertension, Diabetes, and Hyperlipidemia  This visit occurred during the SARS-CoV-2 public health emergency.  Safety protocols were in place, including screening questions prior to the visit, additional usage of staff PPE, and extensive cleaning of exam room while observing appropriate contact time as indicated for disinfecting solutions.    HPI [name] presents for f/up - He tells me his blood pressure has been well controlled.  He complains of weight gain and admits he has not been working on his lifestyle modifications but otherwise feels well and offers no other complaints.  Outpatient Medications Prior to Visit  Medication Sig Dispense Refill  . nitroGLYCERIN (NITRODUR - DOSED IN MG/24 HR) 0.2 mg/hr patch Apply 1/4 patch daily to tendon for tendonitis. 30 patch 3  . irbesartan (AVAPRO) 150 MG tablet TAKE 1 TABLET DAILY 90 tablet 0  . levothyroxine (SYNTHROID) 175 MCG tablet TAKE 1 TABLET DAILY BEFORE BREAKFAST 90 tablet 0   No facility-administered medications prior to visit.    ROS Review of Systems  Constitutional: Positive for unexpected weight change (wt gain). Negative for chills, diaphoresis and fatigue.  HENT: Negative.   Eyes: Negative.   Respiratory: Negative for cough, chest tightness, shortness of breath and wheezing.   Cardiovascular: Negative for chest pain, palpitations and leg swelling.  Gastrointestinal: Negative for abdominal pain, constipation, diarrhea, nausea and vomiting.  Endocrine: Negative for cold intolerance and heat intolerance.  Genitourinary: Negative.  Negative for difficulty urinating.  Musculoskeletal: Negative for arthralgias and myalgias.  Skin: Negative.  Negative for color change and pallor.  Neurological: Negative.  Negative for dizziness.  Hematological: Negative for adenopathy. Does not bruise/bleed easily.  Psychiatric/Behavioral:  Negative.     Objective:  BP 126/84   Pulse 75   Temp 98.3 F (36.8 C) (Oral)   Ht 5\' 8"  (1.727 m)   Wt 223 lb (101.2 kg)   SpO2 98%   BMI 33.91 kg/m   BP Readings from Last 3 Encounters:  01/13/20 126/84  11/04/19 104/76  09/23/19 110/78    Wt Readings from Last 3 Encounters:  01/13/20 223 lb (101.2 kg)  11/04/19 218 lb 6.4 oz (99.1 kg)  09/23/19 212 lb (96.2 kg)    Physical Exam Vitals reviewed.  HENT:     Nose: Nose normal.     Mouth/Throat:     Mouth: Mucous membranes are moist.  Eyes:     General: No scleral icterus.    Conjunctiva/sclera: Conjunctivae normal.  Cardiovascular:     Rate and Rhythm: Normal rate and regular rhythm.     Heart sounds: No murmur heard.   Pulmonary:     Effort: Pulmonary effort is normal.     Breath sounds: No stridor. No wheezing, rhonchi or rales.  Abdominal:     General: Abdomen is protuberant. Bowel sounds are normal. There is no distension.     Palpations: Abdomen is soft. There is no hepatomegaly, splenomegaly or mass.     Tenderness: There is no abdominal tenderness.  Musculoskeletal:        General: Normal range of motion.     Cervical back: Neck supple.     Right lower leg: No edema.     Left lower leg: No edema.  Lymphadenopathy:     Cervical: No cervical adenopathy.  Skin:    General: Skin is warm and dry.  Neurological:     General: No  focal deficit present.     Mental Status: He is alert.     Lab Results  Component Value Date   WBC 4.5 07/07/2019   HGB 13.1 07/07/2019   HCT 40.8 07/07/2019   PLT 223.0 07/07/2019   GLUCOSE 112 (H) 01/13/2020   CHOL 219 (H) 01/13/2020   TRIG 270.0 (H) 01/13/2020   HDL 31.70 (L) 01/13/2020   LDLDIRECT 150.0 01/13/2020   LDLCALC 117 (H) 07/07/2019   ALT 20 01/13/2020   AST 17 01/13/2020   NA 137 01/13/2020   K 3.7 01/13/2020   CL 101 01/13/2020   CREATININE 1.05 01/13/2020   BUN 10 01/13/2020   CO2 30 01/13/2020   TSH 1.59 01/13/2020   INR 1.0 07/07/2019    HGBA1C 6.2 01/13/2020   MICROALBUR <0.7 07/07/2019    CT ABDOMEN PELVIS W CONTRAST  Result Date: 11/02/2017 CLINICAL DATA:  44 y/o M; right lower quadrant abdominal pain. Progressive nausea and chills. EXAM: CT ABDOMEN AND PELVIS WITH CONTRAST TECHNIQUE: Multidetector CT imaging of the abdomen and pelvis was performed using the standard protocol following bolus administration of intravenous contrast. CONTRAST:  ISOVUE-300 IOPAMIDOL (ISOVUE-300) INJECTION 61% COMPARISON:  None. FINDINGS: Lower chest: No acute abnormality. Hepatobiliary: No focal liver abnormality is seen. No gallstones, gallbladder wall thickening, or biliary dilatation. Pancreas: Unremarkable. No pancreatic ductal dilatation or surrounding inflammatory changes. Spleen: Normal in size without focal abnormality. Adrenals/Urinary Tract: Adrenal glands are unremarkable. 12 mm cyst within the right kidney lower pole subcentimeter cyst in the left kidney lower pole. Left kidney lower pole punctate nonobstructing stone. No ureter stone or hydronephrosis. Normal bladder. Stomach/Bowel: Normal appearance of stomach, small bowel, and colon excepting below. Acute appendicitis. Appendix: Location: Retrocecal Diameter: 12 mm Appendicolith: Negative. Mucosal hyper-enhancement: Positive. Extraluminal gas: Negative. Periappendiceal collection: Periappendiceal edema without rim enhancing collection. Vascular/Lymphatic: No significant vascular findings are present. No enlarged abdominal or pelvic lymph nodes. Reproductive: Prostate is unremarkable. Other: Small left inguinal hernia containing fat. Musculoskeletal: No fracture is seen. IMPRESSION: 1. Acute appendicitis. No findings of perforation or abscess at this time. 2. Left kidney lower pole punctate nonobstructing stone. 3. Small left inguinal hernia containing fat. These results were called by telephone at the time of interpretation on 11/02/2017 at 11:10 pm to Dr. Chaney Malling , who verbally  acknowledged these results. Electronically Signed   By: Mitzi Hansen M.D.   On: 11/02/2017 23:12    Assessment & Plan:   Tywon was seen today for hypertension, diabetes and hyperlipidemia.  Diagnoses and all orders for this visit:  Type II diabetes mellitus with manifestations (HCC)- His blood sugar is adequately well controlled.  Medical therapy is not indicated. -     irbesartan (AVAPRO) 150 MG tablet; Take 1 tablet (150 mg total) by mouth daily. -     Basic metabolic panel; Future -     Hemoglobin A1c; Future -     Hemoglobin A1c -     Basic metabolic panel -     HM Diabetes Foot Exam  Acquired hypothyroidism- His TSH is in the normal range.  He will stay on the current dose of levothyroxine. -     TSH; Future -     TSH -     levothyroxine (SYNTHROID) 175 MCG tablet; Take 1 tablet (175 mcg total) by mouth daily before breakfast.  Essential hypertension- His blood pressure is adequately well controlled. -     irbesartan (AVAPRO) 150 MG tablet; Take 1 tablet (150 mg total) by  mouth daily.  Mixed hyperlipidemia- His triglycerides remain elevated.  I have encouraged him to improve his lifestyle modifications. -     Lipid panel; Future -     Hepatic function panel; Future -     Hepatic function panel -     Lipid panel  Hyperlipidemia LDL goal <130- I have asked him to take a statin for cardiovascular risk reduction. -     rosuvastatin (CRESTOR) 10 MG tablet; Take 1 tablet (10 mg total) by mouth daily.  Other orders -     Flu Vaccine QUAD 6+ mos PF IM (Fluarix Quad PF) -     LDL cholesterol, direct   I have changed Dionysios Perazzo's irbesartan and levothyroxine. I am also having him start on rosuvastatin. Additionally, I am having him maintain his nitroGLYCERIN.  Meds ordered this encounter  Medications  . irbesartan (AVAPRO) 150 MG tablet    Sig: Take 1 tablet (150 mg total) by mouth daily.    Dispense:  90 tablet    Refill:  1  . levothyroxine (SYNTHROID) 175  MCG tablet    Sig: Take 1 tablet (175 mcg total) by mouth daily before breakfast.    Dispense:  90 tablet    Refill:  1  . rosuvastatin (CRESTOR) 10 MG tablet    Sig: Take 1 tablet (10 mg total) by mouth daily.    Dispense:  90 tablet    Refill:  1     Follow-up: No follow-ups on file.  Sanda Linger, MD

## 2020-01-14 DIAGNOSIS — E785 Hyperlipidemia, unspecified: Secondary | ICD-10-CM | POA: Insufficient documentation

## 2020-01-14 MED ORDER — LEVOTHYROXINE SODIUM 175 MCG PO TABS: 175.0000 ug | ORAL_TABLET | Freq: Every day | ORAL | 1 refills | Status: DC

## 2020-01-14 MED ORDER — ROSUVASTATIN CALCIUM 10 MG PO TABS: 10.0000 mg | ORAL_TABLET | Freq: Every day | ORAL | 1 refills | Status: DC

## 2020-01-15 ENCOUNTER — Encounter: Payer: Self-pay | Admitting: Internal Medicine

## 2020-06-26 ENCOUNTER — Other Ambulatory Visit: Payer: Self-pay | Admitting: Internal Medicine

## 2020-06-26 DIAGNOSIS — E039 Hypothyroidism, unspecified: Secondary | ICD-10-CM

## 2020-09-15 ENCOUNTER — Other Ambulatory Visit: Payer: Self-pay | Admitting: Internal Medicine

## 2020-09-15 DIAGNOSIS — E118 Type 2 diabetes mellitus with unspecified complications: Secondary | ICD-10-CM

## 2020-09-15 DIAGNOSIS — I10 Essential (primary) hypertension: Secondary | ICD-10-CM

## 2020-09-22 ENCOUNTER — Other Ambulatory Visit: Payer: Self-pay | Admitting: Internal Medicine

## 2020-09-22 DIAGNOSIS — E039 Hypothyroidism, unspecified: Secondary | ICD-10-CM

## 2020-10-11 ENCOUNTER — Telehealth: Payer: Self-pay

## 2020-10-11 NOTE — Telephone Encounter (Signed)
1.Medication Requested:  Levothyroxine & rosuvastatin  2. Pharmacy (Name, Street, [place]): [facility]  3. On Med List: yes  4. Last Visit with PCP: 01/13/2020    Agent: Please be advised that RX refills may take up to 3 business days. We ask that you follow-up with your pharmacy.

## 2020-10-12 ENCOUNTER — Other Ambulatory Visit: Payer: Self-pay | Admitting: Internal Medicine

## 2020-10-12 DIAGNOSIS — E785 Hyperlipidemia, unspecified: Secondary | ICD-10-CM

## 2020-10-12 DIAGNOSIS — E039 Hypothyroidism, unspecified: Secondary | ICD-10-CM

## 2020-10-12 DIAGNOSIS — E118 Type 2 diabetes mellitus with unspecified complications: Secondary | ICD-10-CM

## 2020-10-12 DIAGNOSIS — I10 Essential (primary) hypertension: Secondary | ICD-10-CM

## 2020-10-12 MED ORDER — IRBESARTAN 150 MG PO TABS: 150.0000 mg | ORAL_TABLET | Freq: Every day | ORAL | 0 refills | Status: DC

## 2020-10-12 MED ORDER — LEVOTHYROXINE SODIUM 175 MCG PO TABS: 175.0000 ug | ORAL_TABLET | Freq: Every day | ORAL | 0 refills | Status: DC

## 2020-10-12 MED ORDER — ROSUVASTATIN CALCIUM 10 MG PO TABS: 10.0000 mg | ORAL_TABLET | Freq: Every day | ORAL | 0 refills | Status: DC

## 2020-10-12 NOTE — Telephone Encounter (Signed)
Pt has moved his OV up to 9/12 @ 2.20pm. Would like to know if a short supply can be sent in.

## 2020-10-22 IMAGING — DX DG ELBOW COMPLETE 3+V*R*
4 series · 4 of 4 positions shown · non-contrast
Comparison: None.

CLINICAL DATA: Right elbow pain.  No known injury.

EXAM:
RIGHT ELBOW - COMPLETE 3+ VIEW

[elbow ap]
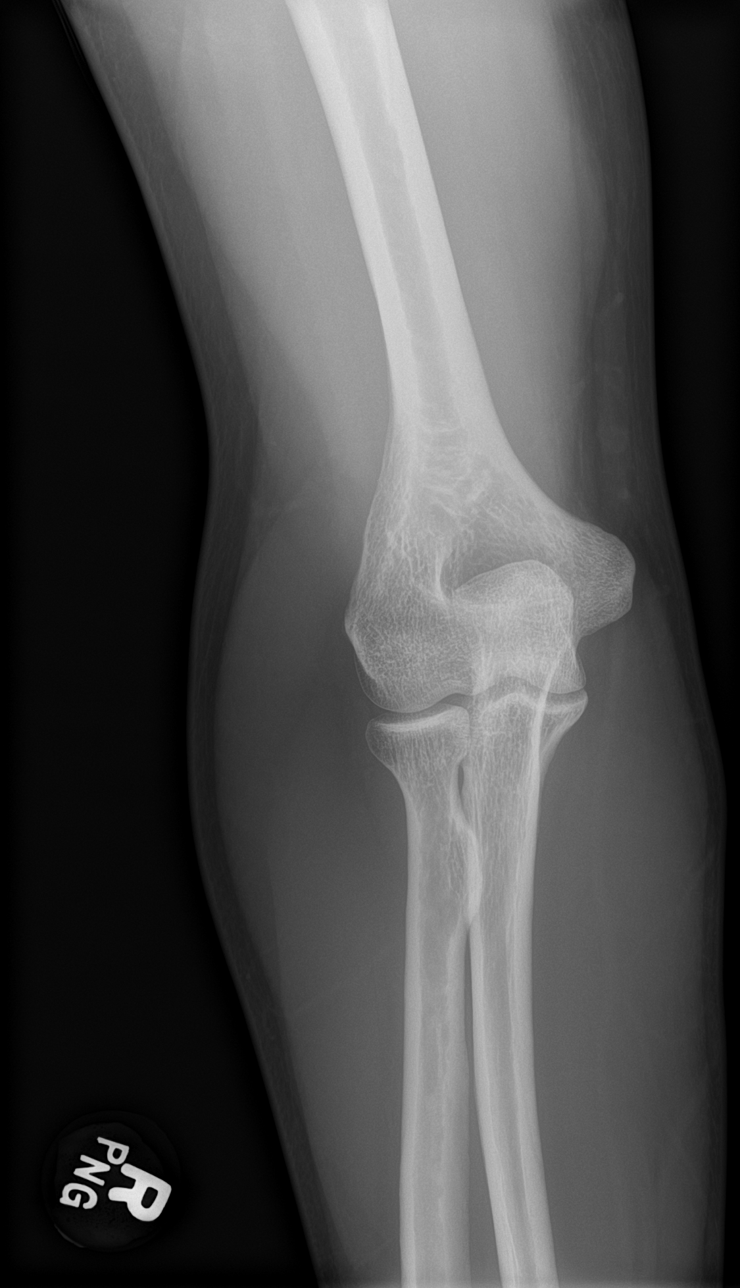

[elbow obl (1 of 2)]
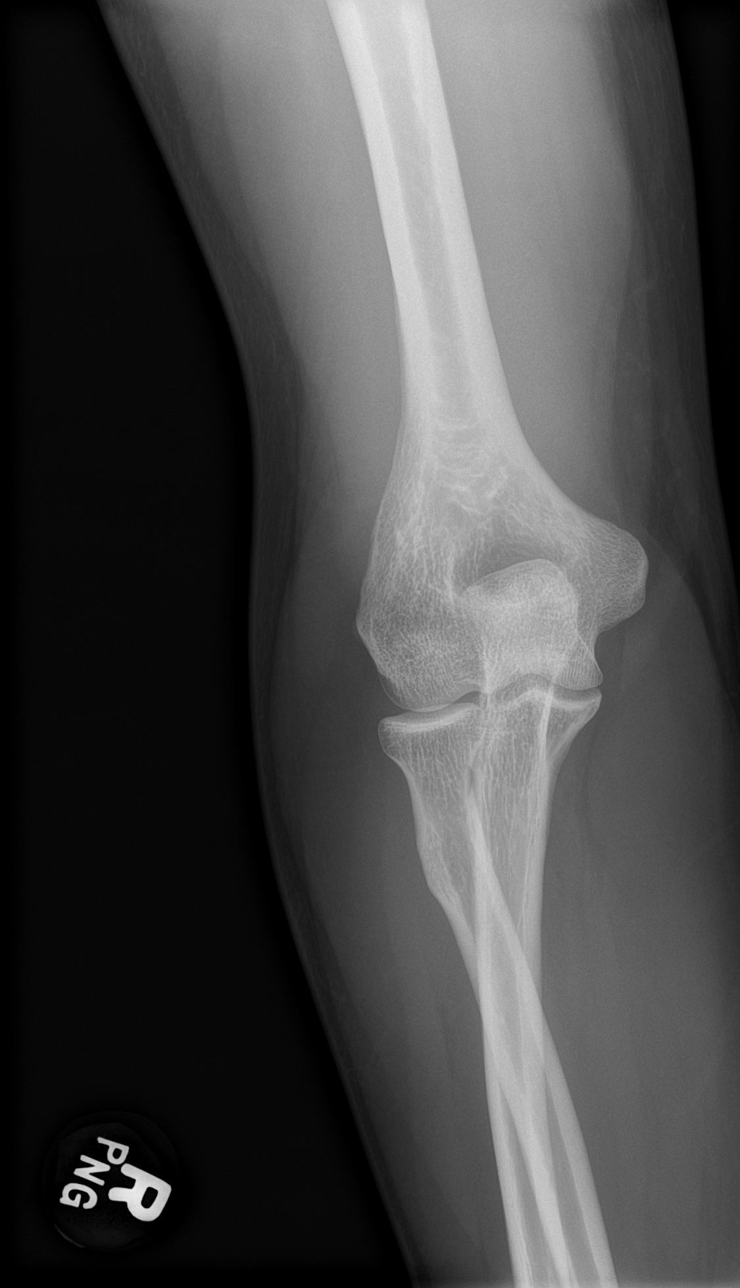

[elbow obl (2 of 2)]
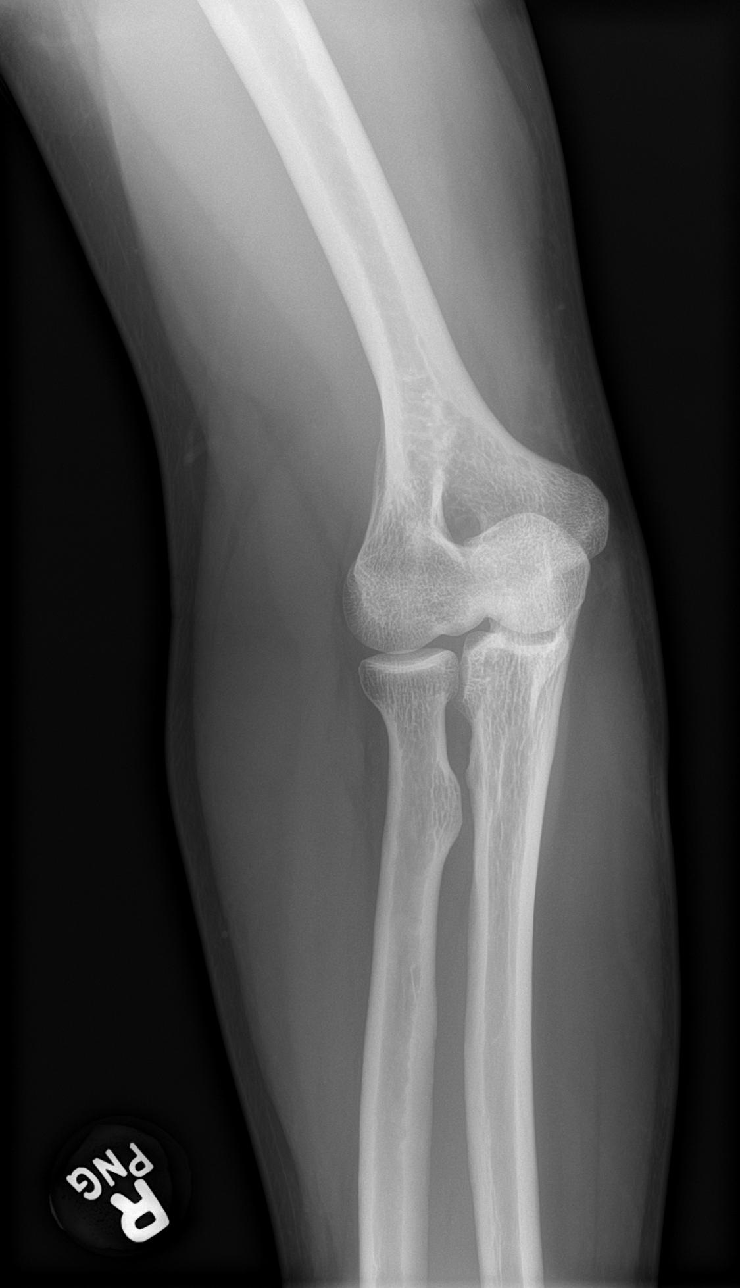

[elbow lat]
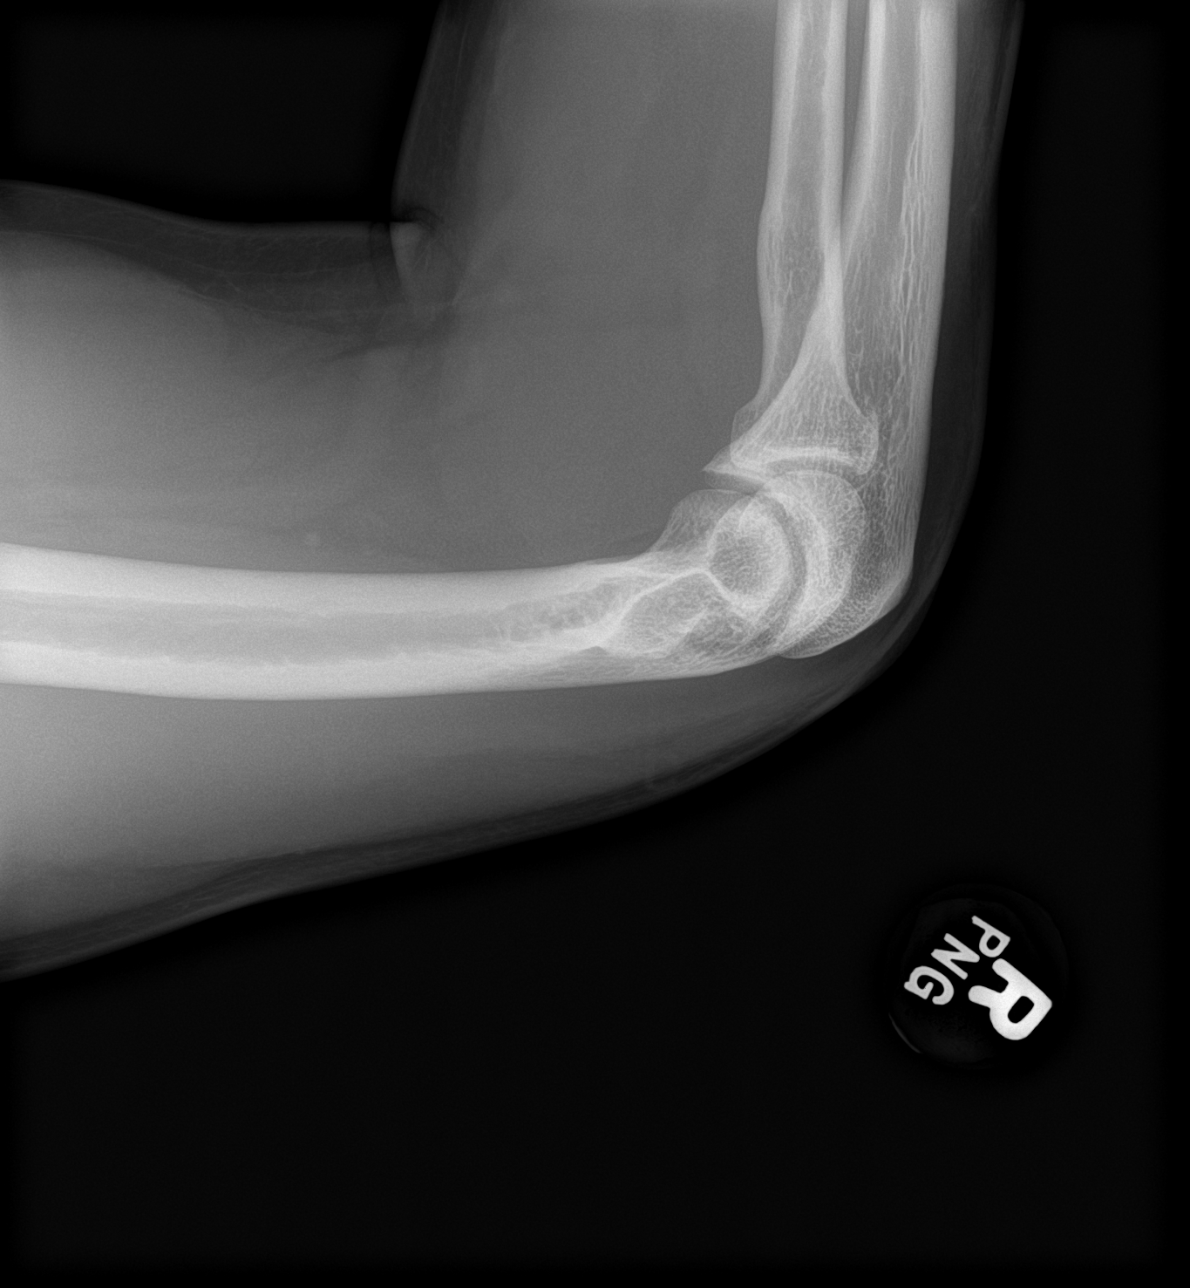

[4 of 4 positions shown; findings below may reference images not displayed]

FINDINGS: There is no evidence of fracture, dislocation, or joint effusion.
There is no evidence of arthropathy or other focal bone abnormality.
Soft tissues are unremarkable.
IMPRESSION: Negative.

## 2020-10-23 ENCOUNTER — Other Ambulatory Visit: Payer: Self-pay

## 2020-10-23 ENCOUNTER — Encounter: Payer: Self-pay | Admitting: Internal Medicine

## 2020-10-23 ENCOUNTER — Ambulatory Visit: Payer: Managed Care, Other (non HMO) | Admitting: Internal Medicine

## 2020-10-23 VITALS — BP 128/86 | HR 69 | Temp 98.6°F | Resp 16 | Ht 68.0 in | Wt 200.0 lb

## 2020-10-23 DIAGNOSIS — Z1159 Encounter for screening for other viral diseases: Secondary | ICD-10-CM | POA: Insufficient documentation

## 2020-10-23 DIAGNOSIS — E782 Mixed hyperlipidemia: Secondary | ICD-10-CM | POA: Diagnosis not present

## 2020-10-23 DIAGNOSIS — E039 Hypothyroidism, unspecified: Secondary | ICD-10-CM | POA: Diagnosis not present

## 2020-10-23 DIAGNOSIS — E559 Vitamin D deficiency, unspecified: Secondary | ICD-10-CM | POA: Diagnosis not present

## 2020-10-23 DIAGNOSIS — I10 Essential (primary) hypertension: Secondary | ICD-10-CM | POA: Diagnosis not present

## 2020-10-23 DIAGNOSIS — E785 Hyperlipidemia, unspecified: Secondary | ICD-10-CM

## 2020-10-23 DIAGNOSIS — E118 Type 2 diabetes mellitus with unspecified complications: Secondary | ICD-10-CM

## 2020-10-23 DIAGNOSIS — Z23 Encounter for immunization: Secondary | ICD-10-CM | POA: Diagnosis not present

## 2020-10-23 DIAGNOSIS — Z0001 Encounter for general adult medical examination with abnormal findings: Secondary | ICD-10-CM | POA: Diagnosis not present

## 2020-10-23 LAB — BASIC METABOLIC PANEL
BUN: 10 mg/dL (ref 6–23)
CO2: 28 mEq/L (ref 19–32)
Calcium: 9.7 mg/dL (ref 8.4–10.5)
Chloride: 103 mEq/L (ref 96–112)
Creatinine, Ser: 0.95 mg/dL (ref 0.40–1.50)
GFR: 98.37 mL/min (ref >60.00)
Glucose, Bld: 83 mg/dL (ref 70–99)
Potassium: 4 mEq/L (ref 3.5–5.1)
Sodium: 139 mEq/L (ref 135–145)

## 2020-10-23 LAB — HEMOGLOBIN A1C: Hgb A1c MFr Bld: 6.1 % (ref 4.6–6.5)

## 2020-10-23 LAB — LIPID PANEL
Cholesterol: 204 mg/dL — ABNORMAL HIGH (ref 0–200)
HDL: 34.6 mg/dL — ABNORMAL LOW (ref >39.00)
NonHDL: 169.15
Total CHOL/HDL Ratio: 6
Triglycerides: 244 mg/dL — ABNORMAL HIGH (ref 0.0–149.0)
VLDL: 48.8 mg/dL — ABNORMAL HIGH (ref 0.0–40.0)

## 2020-10-23 LAB — LDL CHOLESTEROL, DIRECT: Direct LDL: 144 mg/dL

## 2020-10-23 LAB — TSH: TSH: 0.09 u[IU]/mL — ABNORMAL LOW (ref 0.35–5.50)

## 2020-10-23 LAB — VITAMIN D 25 HYDROXY (VIT D DEFICIENCY, FRACTURES): VITD: 16.66 ng/mL — ABNORMAL LOW (ref 30.00–100.00)

## 2020-10-23 NOTE — Progress Notes (Signed)
Subjective:  Patient ID: Travis Sawyer, male    DOB: [birth year]  Age: 45 y.o. MRN: [id number]  CC: Annual Exam, Hypertension, Hypothyroidism, and Diabetes  This visit occurred during the SARS-CoV-2 public health emergency.  Safety protocols were in place, including screening questions prior to the visit, additional usage of staff PPE, and extensive cleaning of exam room while observing appropriate contact time as indicated for disinfecting solutions.    HPI Travis Sawyer presents for a CPX and f/up -  He has lost weight recently.  He thinks this is due to lifestyle modifications, particularly decreased caloric intake.  He has otherwise felt well recently and offers no other complaints.  Outpatient Medications Prior to Visit  Medication Sig Dispense Refill   irbesartan (AVAPRO) 150 MG tablet Take 1 tablet (150 mg total) by mouth daily. 30 tablet 0   levothyroxine (SYNTHROID) 175 MCG tablet Take 1 tablet (175 mcg total) by mouth daily before breakfast. 30 tablet 0   rosuvastatin (CRESTOR) 10 MG tablet Take 1 tablet (10 mg total) by mouth daily. (Patient not taking: Reported on 10/23/2020) 30 tablet 0   No facility-administered medications prior to visit.    ROS Review of Systems  Constitutional:  Positive for unexpected weight change. Negative for diaphoresis and fatigue.  HENT: Negative.    Eyes: Negative.   Respiratory:  Negative for cough, chest tightness, shortness of breath and wheezing.   Cardiovascular:  Negative for chest pain, palpitations and leg swelling.  Gastrointestinal:  Negative for abdominal pain, constipation, diarrhea, nausea and vomiting.  Endocrine: Negative.   Genitourinary: Negative.  Negative for difficulty urinating, scrotal swelling and testicular pain.  Musculoskeletal: Negative.  Negative for arthralgias and myalgias.  Skin: Negative.  Negative for color change.  Neurological: Negative.  Negative for dizziness, weakness, light-headedness and headaches.   Hematological:  Negative for adenopathy. Does not bruise/bleed easily.  Psychiatric/Behavioral: Negative.     Objective:  BP 128/86 (BP Location: Right Arm, Patient Position: Sitting, Cuff Size: Large)   Pulse 69   Temp 98.6 F (37 C) (Oral)   Resp 16   Ht 5\' 8"  (1.727 m)   Wt 200 lb (90.7 kg)   SpO2 100%   BMI 30.41 kg/m   BP Readings from Last 3 Encounters:  10/23/20 128/86  01/13/20 126/84  11/04/19 104/76    Wt Readings from Last 3 Encounters:  10/23/20 200 lb (90.7 kg)  01/13/20 223 lb (101.2 kg)  11/04/19 218 lb 6.4 oz (99.1 kg)    Physical Exam Vitals reviewed.  Constitutional:      Appearance: Normal appearance.  HENT:     Nose: Nose normal.     Mouth/Throat:     Mouth: Mucous membranes are moist.  Eyes:     General: No scleral icterus.    Conjunctiva/sclera: Conjunctivae normal.  Cardiovascular:     Rate and Rhythm: Normal rate and regular rhythm.     Heart sounds: No murmur heard. Pulmonary:     Effort: Pulmonary effort is normal.     Breath sounds: No stridor. No wheezing, rhonchi or rales.  Abdominal:     General: Abdomen is flat. Bowel sounds are normal. There is no distension.     Palpations: Abdomen is soft. There is no hepatomegaly, splenomegaly or mass.     Tenderness: There is no abdominal tenderness.  Musculoskeletal:        General: Normal range of motion.     Cervical back: Neck supple.     Right lower  leg: No edema.     Left lower leg: No edema.  Lymphadenopathy:     Cervical: No cervical adenopathy.  Skin:    General: Skin is warm.  Neurological:     General: No focal deficit present.     Mental Status: He is alert.  Psychiatric:        Mood and Affect: Mood normal.    Lab Results  Component Value Date   WBC 4.5 07/07/2019   HGB 13.1 07/07/2019   HCT 40.8 07/07/2019   PLT 223.0 07/07/2019   GLUCOSE 83 10/23/2020   CHOL 204 (H) 10/23/2020   TRIG 244.0 (H) 10/23/2020   HDL 34.60 (L) 10/23/2020   LDLDIRECT 144.0  10/23/2020   LDLCALC 117 (H) 07/07/2019   ALT 20 01/13/2020   AST 17 01/13/2020   NA 139 10/23/2020   K 4.0 10/23/2020   CL 103 10/23/2020   CREATININE 0.95 10/23/2020   BUN 10 10/23/2020   CO2 28 10/23/2020   TSH 0.09 (L) 10/23/2020   INR 1.0 07/07/2019   HGBA1C 6.1 10/23/2020   MICROALBUR <0.7 07/07/2019    CT ABDOMEN PELVIS W CONTRAST  Result Date: 11/02/2017 CLINICAL DATA:  45 y/o M; right lower quadrant abdominal pain. Progressive nausea and chills. EXAM: CT ABDOMEN AND PELVIS WITH CONTRAST TECHNIQUE: Multidetector CT imaging of the abdomen and pelvis was performed using the standard protocol following bolus administration of intravenous contrast. CONTRAST:  ISOVUE-300 IOPAMIDOL (ISOVUE-300) INJECTION 61% COMPARISON:  None. FINDINGS: Lower chest: No acute abnormality. Hepatobiliary: No focal liver abnormality is seen. No gallstones, gallbladder wall thickening, or biliary dilatation. Pancreas: Unremarkable. No pancreatic ductal dilatation or surrounding inflammatory changes. Spleen: Normal in size without focal abnormality. Adrenals/Urinary Tract: Adrenal glands are unremarkable. 12 mm cyst within the right kidney lower pole subcentimeter cyst in the left kidney lower pole. Left kidney lower pole punctate nonobstructing stone. No ureter stone or hydronephrosis. Normal bladder. Stomach/Bowel: Normal appearance of stomach, small bowel, and colon excepting below. Acute appendicitis. Appendix: Location: Retrocecal Diameter: 12 mm Appendicolith: Negative. Mucosal hyper-enhancement: Positive. Extraluminal gas: Negative. Periappendiceal collection: Periappendiceal edema without rim enhancing collection. Vascular/Lymphatic: No significant vascular findings are present. No enlarged abdominal or pelvic lymph nodes. Reproductive: Prostate is unremarkable. Other: Small left inguinal hernia containing fat. Musculoskeletal: No fracture is seen. IMPRESSION: 1. Acute appendicitis. No findings of  perforation or abscess at this time. 2. Left kidney lower pole punctate nonobstructing stone. 3. Small left inguinal hernia containing fat. These results were called by telephone at the time of interpretation on 11/02/2017 at 11:10 pm to Dr. Chaney Malling , who verbally acknowledged these results. Electronically Signed   By: Mitzi Hansen M.D.   On: 11/02/2017 23:12    Assessment & Plan:   Kalonji was seen today for annual exam, hypertension, hypothyroidism and diabetes.  Diagnoses and all orders for this visit:  Essential hypertension- His blood pressure is adequately well controlled. -     Basic metabolic panel; Future -     Basic metabolic panel  Acquired hypothyroidism- His TSH is suppressed.  I will lower his dose of T4. -     TSH; Future -     TSH -     levothyroxine (SYNTHROID) 150 MCG tablet; Take 1 tablet (150 mcg total) by mouth daily.  Type II diabetes mellitus with manifestations (HCC)- His blood sugar is adequately well controlled. -     Basic metabolic panel; Future -     Hemoglobin A1c; Future -  Hemoglobin A1c -     Basic metabolic panel  Mixed hyperlipidemia -     Cancel: Lipid panel; Future  Hyperlipidemia LDL goal <130- Statin therapy is not indicated. -     Cancel: Lipid panel; Future  Need for hepatitis C screening test -     Hepatitis C antibody; Future -     Hepatitis C antibody  Vitamin D deficiency disease -     VITAMIN D 25 Hydroxy (Vit-D Deficiency, Fractures); Future -     VITAMIN D 25 Hydroxy (Vit-D Deficiency, Fractures) -     Cholecalciferol 1.25 MG (50000 UT) capsule; Take 1 capsule (50,000 Units total) by mouth once a week.  Encounter for general adult medical examination with abnormal findings- Exam completed, labs reviewed, vaccines reviewed and updated, no cancer screenings are indicated, patient education was given. -     Lipid panel; Future -     Lipid panel  Other orders -     Flu Vaccine QUAD 6+ mos PF IM (Fluarix Quad PF) -      LDL cholesterol, direct  I have discontinued Carmen Kruckenberg's levothyroxine. I am also having him start on Cholecalciferol and levothyroxine. Additionally, I am having him maintain his irbesartan and rosuvastatin.  Meds ordered this encounter  Medications   Cholecalciferol 1.25 MG (50000 UT) capsule    Sig: Take 1 capsule (50,000 Units total) by mouth once a week.    Dispense:  12 capsule    Refill:  0   levothyroxine (SYNTHROID) 150 MCG tablet    Sig: Take 1 tablet (150 mcg total) by mouth daily.    Dispense:  90 tablet    Refill:  0      Follow-up: Return in about 6 months (around 04/22/2021).  Sanda Linger, MD

## 2020-10-23 NOTE — Patient Instructions (Signed)
Health Maintenance, Male Adopting a healthy lifestyle and getting preventive care are important in promoting health and wellness. Ask your health care provider about: The right schedule for you to have regular tests and exams. Things you can do on your own to prevent diseases and keep yourself healthy. What should I know about diet, weight, and exercise? Eat a healthy diet  Eat a diet that includes plenty of vegetables, fruits, low-fat dairy products, and lean protein. Do not eat a lot of foods that are high in solid fats, added sugars, or sodium. Maintain a healthy weight Body mass index (BMI) is a measurement that can be used to identify possible weight problems. It estimates body fat based on height and weight. Your health care provider can help determine your BMI and help you achieve or maintain a healthy weight. Get regular exercise Get regular exercise. This is one of the most important things you can do for your health. Most adults should: Exercise for at least 150 minutes each week. The exercise should increase your heart rate and make you sweat (moderate-intensity exercise). Do strengthening exercises at least twice a week. This is in addition to the moderate-intensity exercise. Spend less time sitting. Even light physical activity can be beneficial. Watch cholesterol and blood lipids Have your blood tested for lipids and cholesterol at 43 years of age, then have this test every 5 years. You may need to have your cholesterol levels checked more often if: Your lipid or cholesterol levels are high. You are older than 43 years of age. You are at high risk for heart disease. What should I know about cancer screening? Many types of cancers can be detected early and may often be prevented. Depending on your health history and family history, you may need to have cancer screening at various ages. This may include screening for: Colorectal cancer. Prostate cancer. Skin cancer. Lung  cancer. What should I know about heart disease, diabetes, and high blood pressure? Blood pressure and heart disease High blood pressure causes heart disease and increases the risk of stroke. This is more likely to develop in people who have high blood pressure readings, are of African descent, or are overweight. Talk with your health care provider about your target blood pressure readings. Have your blood pressure checked: Every 3-5 years if you are 18-39 years of age. Every year if you are 40 years old or older. If you are between the ages of 65 and 75 and are a current or former smoker, ask your health care provider if you should have a one-time screening for abdominal aortic aneurysm (AAA). Diabetes Have regular diabetes screenings. This checks your fasting blood sugar level. Have the screening done: Once every three years after age 45 if you are at a normal weight and have a low risk for diabetes. More often and at a younger age if you are overweight or have a high risk for diabetes. What should I know about preventing infection? Hepatitis B If you have a higher risk for hepatitis B, you should be screened for this virus. Talk with your health care provider to find out if you are at risk for hepatitis B infection. Hepatitis C Blood testing is recommended for: Everyone born from 1945 through 1965. Anyone with known risk factors for hepatitis C. Sexually transmitted infections (STIs) You should be screened each year for STIs, including gonorrhea and chlamydia, if: You are sexually active and are younger than 43 years of age. You are older than 43 years   of age and your health care provider tells you that you are at risk for this type of infection. Your sexual activity has changed since you were last screened, and you are at increased risk for chlamydia or gonorrhea. Ask your health care provider if you are at risk. Ask your health care provider about whether you are at high risk for HIV.  Your health care provider may recommend a prescription medicine to help prevent HIV infection. If you choose to take medicine to prevent HIV, you should first get tested for HIV. You should then be tested every 3 months for as long as you are taking the medicine. Follow these instructions at home: Lifestyle Do not use any products that contain nicotine or tobacco, such as cigarettes, e-cigarettes, and chewing tobacco. If you need help quitting, ask your health care provider. Do not use street drugs. Do not share needles. Ask your health care provider for help if you need support or information about quitting drugs. Alcohol use Do not drink alcohol if your health care provider tells you not to drink. If you drink alcohol: Limit how much you have to 0-2 drinks a day. Be aware of how much alcohol is in your drink. In the [place] one drink equals one 12 oz bottle of beer (355 mL), one 5 oz glass of wine (148 mL), or one 1 oz glass of hard liquor (44 mL). General instructions Schedule regular health, dental, and eye exams. Stay current with your vaccines. Tell your health care provider if: You often feel depressed. You have ever been abused or do not feel safe at home. Summary Adopting a healthy lifestyle and getting preventive care are important in promoting health and wellness. Follow your health care provider's instructions about healthy diet, exercising, and getting tested or screened for diseases. Follow your health care provider's instructions on monitoring your cholesterol and blood pressure. This information is not intended to replace advice given to you by your health care provider. Make sure you discuss any questions you have with your health care provider. Document Revised: 04/07/2020 Document Reviewed: 01/21/2018 Elsevier Patient Education  2022 [employer]vier Inc[employer]  

## 2020-10-24 LAB — HEPATITIS C ANTIBODY
Hepatitis C Ab: NONREACTIVE
SIGNAL TO CUT-OFF: 0.01 (ref <1.00)

## 2020-10-24 MED ORDER — LEVOTHYROXINE SODIUM 150 MCG PO TABS: 150.0000 ug | ORAL_TABLET | Freq: Every day | ORAL | 0 refills | Status: DC

## 2020-10-24 MED ORDER — CHOLECALCIFEROL 1.25 MG (50000 UT) PO CAPS: 50000.0000 [IU] | ORAL_CAPSULE | ORAL | 0 refills | Status: AC

## 2020-11-08 ENCOUNTER — Other Ambulatory Visit: Payer: Self-pay

## 2020-11-08 ENCOUNTER — Telehealth: Payer: Self-pay | Admitting: Internal Medicine

## 2020-11-08 DIAGNOSIS — I10 Essential (primary) hypertension: Secondary | ICD-10-CM

## 2020-11-08 DIAGNOSIS — E118 Type 2 diabetes mellitus with unspecified complications: Secondary | ICD-10-CM

## 2020-11-08 MED ORDER — IRBESARTAN 150 MG PO TABS: 150.0000 mg | ORAL_TABLET | Freq: Every day | ORAL | 1 refills | Status: DC

## 2020-11-08 NOTE — Telephone Encounter (Signed)
   Patient requesting refill for irbesartan (AVAPRO) 150 MG tablet and rosuvastatin (CRESTOR) 10 MG tablet   Ph[facility]acy [facility] - [place], [place] - [address]

## 2021-01-04 ENCOUNTER — Other Ambulatory Visit: Payer: Self-pay | Admitting: Internal Medicine

## 2021-01-04 DIAGNOSIS — E039 Hypothyroidism, unspecified: Secondary | ICD-10-CM

## 2021-01-15 ENCOUNTER — Encounter: Payer: Self-pay | Admitting: Internal Medicine

## 2021-01-15 ENCOUNTER — Ambulatory Visit (INDEPENDENT_AMBULATORY_CARE_PROVIDER_SITE_OTHER): Payer: Managed Care, Other (non HMO) | Admitting: Internal Medicine

## 2021-01-15 ENCOUNTER — Other Ambulatory Visit: Payer: Self-pay

## 2021-01-15 VITALS — BP 132/86 | HR 75 | Temp 98.1°F | Resp 16 | Ht 68.0 in | Wt 203.0 lb

## 2021-01-15 DIAGNOSIS — E118 Type 2 diabetes mellitus with unspecified complications: Secondary | ICD-10-CM | POA: Diagnosis not present

## 2021-01-15 DIAGNOSIS — E039 Hypothyroidism, unspecified: Secondary | ICD-10-CM | POA: Diagnosis not present

## 2021-01-15 DIAGNOSIS — I10 Essential (primary) hypertension: Secondary | ICD-10-CM | POA: Diagnosis not present

## 2021-01-15 LAB — TSH: TSH: 0.49 u[IU]/mL (ref 0.35–5.50)

## 2021-01-15 MED ORDER — LEVOTHYROXINE SODIUM 150 MCG PO TABS: 150.0000 ug | ORAL_TABLET | Freq: Every day | ORAL | 1 refills | Status: AC

## 2021-01-15 MED ORDER — IRBESARTAN 150 MG PO TABS: 150.0000 mg | ORAL_TABLET | Freq: Every day | ORAL | 1 refills | Status: DC

## 2021-01-15 NOTE — Progress Notes (Signed)
Subjective:  Patient ID: Travis Sawyer, male    DOB: [birth year]  Age: 45 y.o. MRN: [id number]  CC: Hypertension and Hypothyroidism  This visit occurred during the SARS-CoV-2 public health emergency.  Safety protocols were in place, including screening questions prior to the visit, additional usage of staff PPE, and extensive cleaning of exam room while observing appropriate contact time as indicated for disinfecting solutions.    HPI Travis Sawyer presents for f/up -   He has felt well recently. Offers no complaints.  Outpatient Medications Prior to Visit  Medication Sig Dispense Refill   Cholecalciferol 1.25 MG (50000 UT) capsule Take 1 capsule (50,000 Units total) by mouth once a week. 12 capsule 0   irbesartan (AVAPRO) 150 MG tablet Take 1 tablet (150 mg total) by mouth daily. 90 tablet 1   levothyroxine (SYNTHROID) 150 MCG tablet TAKE 1 TABLET DAILY 90 tablet 0   rosuvastatin (CRESTOR) 10 MG tablet Take 1 tablet (10 mg total) by mouth daily. 30 tablet 0   No facility-administered medications prior to visit.    ROS Review of Systems  Constitutional:  Negative for diaphoresis and fatigue.  HENT: Negative.    Respiratory:  Negative for chest tightness, shortness of breath and wheezing.   Cardiovascular:  Negative for chest pain, palpitations and leg swelling.  Gastrointestinal:  Negative for abdominal pain, diarrhea, nausea and vomiting.  Endocrine: Negative for cold intolerance and heat intolerance.  Genitourinary: Negative.  Negative for difficulty urinating.  Musculoskeletal: Negative.   Skin: Negative.  Negative for color change.  Neurological:  Negative for dizziness, weakness and headaches.  Hematological:  Negative for adenopathy. Does not bruise/bleed easily.  Psychiatric/Behavioral: Negative.     Objective:  BP 132/86 (BP Location: Left Arm, Patient Position: Sitting, Cuff Size: Large)   Pulse 75   Temp 98.1 F (36.7 C) (Oral)   Resp 16   Ht 5\' 8"  (1.727 m)   Wt  203 lb (92.1 kg)   SpO2 98%   BMI 30.87 kg/m   BP Readings from Last 3 Encounters:  01/15/21 132/86  10/23/20 128/86  01/13/20 126/84    Wt Readings from Last 3 Encounters:  01/15/21 203 lb (92.1 kg)  10/23/20 200 lb (90.7 kg)  01/13/20 223 lb (101.2 kg)    Physical Exam Vitals reviewed.  HENT:     Nose: Nose normal.     Mouth/Throat:     Mouth: Mucous membranes are moist.  Eyes:     General: No scleral icterus.    Conjunctiva/sclera: Conjunctivae normal.  Cardiovascular:     Rate and Rhythm: Normal rate and regular rhythm.     Heart sounds: No murmur heard. Pulmonary:     Effort: Pulmonary effort is normal.     Breath sounds: No stridor. No wheezing, rhonchi or rales.  Abdominal:     General: Abdomen is flat.     Palpations: There is no mass.     Tenderness: There is no abdominal tenderness. There is no guarding.     Hernia: No hernia is present.  Musculoskeletal:        General: Normal range of motion.     Cervical back: Neck supple.     Right lower leg: No edema.     Left lower leg: No edema.  Lymphadenopathy:     Cervical: No cervical adenopathy.  Skin:    General: Skin is warm and dry.  Neurological:     General: No focal deficit present.     Mental  Status: He is alert.  Psychiatric:        Mood and Affect: Mood normal.        Behavior: Behavior normal.    Lab Results  Component Value Date   WBC 4.5 07/07/2019   HGB 13.1 07/07/2019   HCT 40.8 07/07/2019   PLT 223.0 07/07/2019   GLUCOSE 83 10/23/2020   CHOL 204 (H) 10/23/2020   TRIG 244.0 (H) 10/23/2020   HDL 34.60 (L) 10/23/2020   LDLDIRECT 144.0 10/23/2020   LDLCALC 117 (H) 07/07/2019   ALT 20 01/13/2020   AST 17 01/13/2020   NA 139 10/23/2020   K 4.0 10/23/2020   CL 103 10/23/2020   CREATININE 0.95 10/23/2020   BUN 10 10/23/2020   CO2 28 10/23/2020   TSH 0.49 01/15/2021   INR 1.0 07/07/2019   HGBA1C 6.1 10/23/2020   MICROALBUR <0.7 07/07/2019    CT ABDOMEN PELVIS W  CONTRAST  Result Date: 11/02/2017 CLINICAL DATA:  45 y/o M; right lower quadrant abdominal pain. Progressive nausea and chills. EXAM: CT ABDOMEN AND PELVIS WITH CONTRAST TECHNIQUE: Multidetector CT imaging of the abdomen and pelvis was performed using the standard protocol following bolus administration of intravenous contrast. CONTRAST:  ISOVUE-300 IOPAMIDOL (ISOVUE-300) INJECTION 61% COMPARISON:  None. FINDINGS: Lower chest: No acute abnormality. Hepatobiliary: No focal liver abnormality is seen. No gallstones, gallbladder wall thickening, or biliary dilatation. Pancreas: Unremarkable. No pancreatic ductal dilatation or surrounding inflammatory changes. Spleen: Normal in size without focal abnormality. Adrenals/Urinary Tract: Adrenal glands are unremarkable. 12 mm cyst within the right kidney lower pole subcentimeter cyst in the left kidney lower pole. Left kidney lower pole punctate nonobstructing stone. No ureter stone or hydronephrosis. Normal bladder. Stomach/Bowel: Normal appearance of stomach, small bowel, and colon excepting below. Acute appendicitis. Appendix: Location: Retrocecal Diameter: 12 mm Appendicolith: Negative. Mucosal hyper-enhancement: Positive. Extraluminal gas: Negative. Periappendiceal collection: Periappendiceal edema without rim enhancing collection. Vascular/Lymphatic: No significant vascular findings are present. No enlarged abdominal or pelvic lymph nodes. Reproductive: Prostate is unremarkable. Other: Small left inguinal hernia containing fat. Musculoskeletal: No fracture is seen. IMPRESSION: 1. Acute appendicitis. No findings of perforation or abscess at this time. 2. Left kidney lower pole punctate nonobstructing stone. 3. Small left inguinal hernia containing fat. These results were called by telephone at the time of interpretation on 11/02/2017 at 11:10 pm to Dr. Chaney Malling , who verbally acknowledged these results. Electronically Signed   By: Mitzi Hansen M.D.    On: 11/02/2017 23:12    Assessment & Plan:   Travis Sawyer was seen today for hypertension and hypothyroidism.  Diagnoses and all orders for this visit:  Acquired hypothyroidism- His TSH is in the normal range. Will stay on the current T4 dose. -     TSH; Future -     TSH -     levothyroxine (SYNTHROID) 150 MCG tablet; Take 1 tablet (150 mcg total) by mouth daily.  Type II diabetes mellitus with manifestations (HCC)- His blood sugar is well controlled. -     irbesartan (AVAPRO) 150 MG tablet; Take 1 tablet (150 mg total) by mouth daily.  Essential hypertension- His blood pressure is well controlled. -     irbesartan (AVAPRO) 150 MG tablet; Take 1 tablet (150 mg total) by mouth daily.  I have discontinued Jennie Apachito's rosuvastatin. I have also changed his levothyroxine. Additionally, I am having him maintain his Cholecalciferol and irbesartan.  Meds ordered this encounter  Medications   irbesartan (AVAPRO) 150 MG tablet  Sig: Take 1 tablet (150 mg total) by mouth daily.    Dispense:  90 tablet    Refill:  1   levothyroxine (SYNTHROID) 150 MCG tablet    Sig: Take 1 tablet (150 mcg total) by mouth daily.    Dispense:  90 tablet    Refill:  1     Follow-up: Return in about 6 months (around 07/16/2021).  Sanda Linger, MD

## 2021-01-15 NOTE — Patient Instructions (Signed)

## 2021-07-16 ENCOUNTER — Other Ambulatory Visit: Payer: Self-pay | Admitting: Internal Medicine

## 2021-07-16 DIAGNOSIS — I10 Essential (primary) hypertension: Secondary | ICD-10-CM

## 2021-07-16 DIAGNOSIS — E118 Type 2 diabetes mellitus with unspecified complications: Secondary | ICD-10-CM
